# Patient Record
Sex: Male | Born: 1935 | Race: White | Hispanic: No | Smoking: Former smoker
Health system: Southern US, Community
[De-identification: ages and names within clinical notes are randomized; demographics above are authoritative.]

## PROBLEM LIST (undated history)

## (undated) DIAGNOSIS — E785 Hyperlipidemia, unspecified: Secondary | ICD-10-CM

## (undated) DIAGNOSIS — J42 Unspecified chronic bronchitis: Secondary | ICD-10-CM

## (undated) DIAGNOSIS — R0600 Dyspnea, unspecified: Secondary | ICD-10-CM

## (undated) DIAGNOSIS — T783XXA Angioneurotic edema, initial encounter: Secondary | ICD-10-CM

## (undated) DIAGNOSIS — T7840XA Allergy, unspecified, initial encounter: Secondary | ICD-10-CM

## (undated) DIAGNOSIS — R05 Cough: Secondary | ICD-10-CM

## (undated) DIAGNOSIS — R053 Chronic cough: Secondary | ICD-10-CM

## (undated) DIAGNOSIS — Z87442 Personal history of urinary calculi: Secondary | ICD-10-CM

## (undated) DIAGNOSIS — R2241 Localized swelling, mass and lump, right lower limb: Secondary | ICD-10-CM

## (undated) DIAGNOSIS — H269 Unspecified cataract: Secondary | ICD-10-CM

## (undated) DIAGNOSIS — R011 Cardiac murmur, unspecified: Secondary | ICD-10-CM

## (undated) DIAGNOSIS — J069 Acute upper respiratory infection, unspecified: Secondary | ICD-10-CM

## (undated) DIAGNOSIS — Z77090 Contact with and (suspected) exposure to asbestos: Secondary | ICD-10-CM

## (undated) DIAGNOSIS — W64XXXA Exposure to other animate mechanical forces, initial encounter: Secondary | ICD-10-CM

## (undated) HISTORY — DX: Localized swelling, mass and lump, right lower limb: R22.41

## (undated) HISTORY — DX: Unspecified cataract: H26.9

## (undated) HISTORY — DX: Hyperlipidemia, unspecified: E78.5

## (undated) HISTORY — DX: Acute upper respiratory infection, unspecified: J06.9

## (undated) HISTORY — DX: Cardiac murmur, unspecified: R01.1

## (undated) HISTORY — DX: Exposure to other animate mechanical forces, initial encounter: W64.XXXA

## (undated) HISTORY — DX: Unspecified chronic bronchitis: J42

## (undated) HISTORY — PX: EYE SURGERY: SHX253

## (undated) HISTORY — DX: Angioneurotic edema, initial encounter: T78.3XXA

## (undated) HISTORY — DX: Allergy, unspecified, initial encounter: T78.40XA

---

## 2001-09-08 ENCOUNTER — Ambulatory Visit (HOSPITAL_COMMUNITY): Admission: RE | Admit: 2001-09-08 | Discharge: 2001-09-08 | Payer: Self-pay | Admitting: Gastroenterology

## 2002-07-11 ENCOUNTER — Ambulatory Visit (HOSPITAL_COMMUNITY): Admission: RE | Admit: 2002-07-11 | Discharge: 2002-07-11 | Payer: Self-pay | Admitting: Pulmonary Disease

## 2002-07-20 ENCOUNTER — Ambulatory Visit (HOSPITAL_COMMUNITY): Admission: RE | Admit: 2002-07-20 | Discharge: 2002-07-20 | Payer: Self-pay | Admitting: Pulmonary Disease

## 2003-08-17 ENCOUNTER — Ambulatory Visit (HOSPITAL_COMMUNITY): Admission: RE | Admit: 2003-08-17 | Discharge: 2003-08-17 | Payer: Self-pay | Admitting: Pulmonary Disease

## 2009-01-29 ENCOUNTER — Ambulatory Visit (HOSPITAL_COMMUNITY): Admission: RE | Admit: 2009-01-29 | Discharge: 2009-01-29 | Payer: Self-pay | Admitting: Pulmonary Disease

## 2011-11-27 ENCOUNTER — Emergency Department (HOSPITAL_COMMUNITY): Payer: Medicare Other

## 2011-11-27 ENCOUNTER — Encounter (HOSPITAL_COMMUNITY): Payer: Self-pay | Admitting: Emergency Medicine

## 2011-11-27 ENCOUNTER — Emergency Department (HOSPITAL_COMMUNITY)
Admission: EM | Admit: 2011-11-27 | Discharge: 2011-11-27 | Disposition: A | Discharge status: Home / Self Care | Payer: Medicare Other | Attending: Emergency Medicine | Admitting: Emergency Medicine

## 2011-11-27 DIAGNOSIS — R05 Cough: Secondary | ICD-10-CM

## 2011-11-27 DIAGNOSIS — J9801 Acute bronchospasm: Secondary | ICD-10-CM

## 2011-11-27 HISTORY — DX: Contact with and (suspected) exposure to asbestos: Z77.090

## 2011-11-27 HISTORY — DX: Cough: R05

## 2011-11-27 HISTORY — DX: Chronic cough: R05.3

## 2011-11-27 HISTORY — DX: Hyperlipidemia, unspecified: E78.5

## 2011-11-27 HISTORY — DX: Dyspnea, unspecified: R06.00

## 2011-11-27 LAB — BASIC METABOLIC PANEL
BUN: 10 mg/dL (ref 6–23)
CO2: 29 mEq/L (ref 19–32)
Calcium: 9.7 mg/dL (ref 8.4–10.5)
Chloride: 98 mEq/L (ref 96–112)
Creatinine, Ser: 1.12 mg/dL (ref 0.50–1.35)
GFR calc Af Amer: 73 mL/min — ABNORMAL LOW (ref >90)
GFR calc non Af Amer: 63 mL/min — ABNORMAL LOW (ref >90)
Glucose, Bld: 104 mg/dL — ABNORMAL HIGH (ref 70–99)
Potassium: 4.7 mEq/L (ref 3.5–5.1)
Sodium: 134 mEq/L — ABNORMAL LOW (ref 135–145)

## 2011-11-27 LAB — TROPONIN I: Troponin I: <0.3 ng/mL (ref <0.30)

## 2011-11-27 LAB — CBC WITH DIFFERENTIAL/PLATELET
Basophils Absolute: 0.1 10*3/uL (ref 0.0–0.1)
Basophils Relative: 1 % (ref 0–1)
Eosinophils Absolute: 0.8 10*3/uL — ABNORMAL HIGH (ref 0.0–0.7)
Eosinophils Relative: 12 % — ABNORMAL HIGH (ref 0–5)
HCT: 44.8 % (ref 39.0–52.0)
Hemoglobin: 14.6 g/dL (ref 13.0–17.0)
Lymphocytes Relative: 28 % (ref 12–46)
Lymphs Abs: 2 10*3/uL (ref 0.7–4.0)
MCH: 29.9 pg (ref 26.0–34.0)
MCHC: 32.6 g/dL (ref 30.0–36.0)
MCV: 91.8 fL (ref 78.0–100.0)
Monocytes Absolute: 0.7 10*3/uL (ref 0.1–1.0)
Monocytes Relative: 10 % (ref 3–12)
Neutro Abs: 3.4 10*3/uL (ref 1.7–7.7)
Neutrophils Relative %: 49 % (ref 43–77)
Platelets: 248 10*3/uL (ref 150–400)
RBC: 4.88 MIL/uL (ref 4.22–5.81)
RDW: 13.7 % (ref 11.5–15.5)
WBC: 7 10*3/uL (ref 4.0–10.5)

## 2011-11-27 LAB — PRO B NATRIURETIC PEPTIDE: Pro B Natriuretic peptide (BNP): 74.9 pg/mL (ref 0–125)

## 2011-11-27 MED ORDER — IPRATROPIUM BROMIDE 0.02 % IN SOLN
1.0000 mg | Freq: Once | RESPIRATORY_TRACT | Status: AC
  Administered 2011-11-27: 0.5 mg via RESPIRATORY_TRACT
  Filled 2011-11-27: qty 2.5

## 2011-11-27 MED ORDER — ALBUTEROL SULFATE HFA 108 (90 BASE) MCG/ACT IN AERS
2.0000 | INHALATION_SPRAY | RESPIRATORY_TRACT | Status: AC
  Administered 2011-11-27: 2 via RESPIRATORY_TRACT
  Filled 2011-11-27: qty 6.7

## 2011-11-27 MED ORDER — PREDNISONE 20 MG PO TABS: 40.0000 mg | ORAL_TABLET | Freq: Every day | ORAL | Status: DC

## 2011-11-27 MED ORDER — PREDNISONE 20 MG PO TABS
60.0000 mg | ORAL_TABLET | Freq: Once | ORAL | Status: AC
  Administered 2011-11-27: 60 mg via ORAL
  Filled 2011-11-27: qty 3

## 2011-11-27 MED ORDER — BENZONATATE 100 MG PO CAPS: 100.0000 mg | ORAL_CAPSULE | Freq: Three times a day (TID) | ORAL | Status: DC | PRN

## 2011-11-27 MED ORDER — ALBUTEROL SULFATE (5 MG/ML) 0.5% IN NEBU
10.0000 mg | INHALATION_SOLUTION | Freq: Once | RESPIRATORY_TRACT | Status: AC
  Administered 2011-11-27: 10 mg via RESPIRATORY_TRACT

## 2011-11-27 MED ORDER — ALBUTEROL (5 MG/ML) CONTINUOUS INHALATION SOLN
INHALATION_SOLUTION | RESPIRATORY_TRACT | Status: AC
  Filled 2011-11-27: qty 20

## 2011-11-27 NOTE — ED Provider Notes (Addendum)
History     CSN: [id number]  Arrival date & time 11/27/11  1141   First MD Initiated Contact with Patient 11/27/11 1312      Chief Complaint  Patient presents with  . Shortness of Breath     HPI Pt was seen at 1330.  Per pt, c/o gradual onset and persistence of constant cough, "wheezing," SOB,  sinus congestion, and runny/stuffy nose for the past 6 months, worse over the past 3 days. Pt was eval by his PMD, ENT and Allergy MD for same symptoms without a definitive dx.  Pt states he was recently on a course of prednisone, which made his symptoms better.  States his symptoms "come back" after he stops the prednisone.  Denies having MDI or neb at home.  Denies CP/palpitations, no abd pain, no N/V/D, no fevers, no rash.    Past Medical History  Diagnosis Date  . Hyperlipidemia   . Chronic cough   . Dyspnea     chronic  . Asbestos exposure     History reviewed. No pertinent past surgical history.   History  Substance Use Topics  . Smoking status: Former Games developer  . Smokeless tobacco: Not on file  . Alcohol Use: No     Review of Systems ROS: Statement: All systems negative except as marked or noted in the HPI; Constitutional: Negative for fever and chills. ; ; Eyes: Negative for eye pain, redness and discharge. ; ; ENMT: Negative for ear pain, hoarseness, sore throat. +nasal congestion, sinus pressure. ; ; Cardiovascular: Negative for chest pain, palpitations, diaphoresis, and peripheral edema. ; ; Respiratory: +cough, wheezing, SOB. Negative for stridor. ; ; Gastrointestinal: Negative for nausea, vomiting, diarrhea, abdominal pain, blood in stool, hematemesis, jaundice and rectal bleeding. . ; ; Genitourinary: Negative for dysuria, flank pain and hematuria. ; ; Musculoskeletal: Negative for back pain and neck pain. Negative for swelling and trauma.; ; Skin: Negative for pruritus, rash, abrasions, blisters, bruising and skin lesion.; ; Neuro: Negative for headache, lightheadedness and  neck stiffness. Negative for weakness, altered level of consciousness , altered mental status, extremity weakness, paresthesias, involuntary movement, seizure and syncope.      Allergies  Review of patient's allergies indicates no known allergies.  Home Medications   Current Outpatient Rx  Name Route Sig Dispense Refill  . ATORVASTATIN CALCIUM 20 MG PO TABS Oral Take 20 mg by mouth daily.    Marland Kitchen FLUTICASONE PROPIONATE 50 MCG/ACT NA SUSP Nasal Place 2 sprays into the nose daily.    . SULFAMETHOXAZOLE-TMP DS 800-160 MG PO TABS Oral Take 1 tablet by mouth 2 (two) times daily.    Marland Kitchen BENZONATATE 100 MG PO CAPS Oral Take 1 capsule (100 mg total) by mouth 3 (three) times daily as needed for cough. 20 capsule 0  . PREDNISONE 20 MG PO TABS Oral Take 2 tablets (40 mg total) by mouth daily. 10 tablet 0    BP 146/65  Pulse 92  Temp 97.6 F (36.4 C) (Oral)  Resp 20  Ht 5\' 8"  (1.727 m)  Wt 182 lb (82.555 kg)  BMI 27.67 kg/m2  SpO2 96%  Physical Exam 1335: Physical examination:  Nursing notes reviewed; Vital signs and O2 SAT reviewed;  Constitutional: Well developed, Well nourished, Well hydrated, In no acute distress; Head:  Normocephalic, atraumatic; Eyes: EOMI, PERRL, No scleral icterus; ENMT: Mouth and pharynx normal, Mucous membranes moist; Neck: Supple, Full range of motion, No lymphadenopathy; Cardiovascular: Regular rate and rhythm, No gallop; Respiratory: Breath sounds coarse &  equal bilaterally, with wheezes bilat.  Speaking full sentences. Normal respiratory effort/excursion; Chest: Nontender, Movement normal; Abdomen: Soft, Nontender, Nondistended, Normal bowel sounds; Extremities: Pulses normal, No tenderness, No edema, No calf edema or asymmetry.; Neuro: AA&Ox3, Major CN grossly intact.  Speech clear. No gross focal motor or sensory deficits in extremities.; Skin: Color normal, Warm, Dry.   ED Course  Procedures    MDM  MDM Reviewed: nursing note, vitals and previous  chart Interpretation: ECG, labs and x-ray    Date: 11/27/2011  Rate: 76  Rhythm: normal sinus rhythm  QRS Axis: normal  Intervals: normal  ST/T Wave abnormalities: normal  Conduction Disutrbances:none  Narrative Interpretation:   Old EKG Reviewed: none available.  Results for orders placed during the hospital encounter of 11/27/11  BASIC METABOLIC PANEL      Component Value Range   Sodium 134 (*) 135 - 145 mEq/L   Potassium 4.7  3.5 - 5.1 mEq/L   Chloride 98  96 - 112 mEq/L   CO2 29  19 - 32 mEq/L   Glucose, Bld 104 (*) 70 - 99 mg/dL   BUN 10  6 - 23 mg/dL   Creatinine, Ser 4.54  0.50 - 1.35 mg/dL   Calcium 9.7  8.4 - 09.8 mg/dL   GFR calc non Af Amer 63 (*) >90 mL/min   GFR calc Af Amer 73 (*) >90 mL/min  CBC WITH DIFFERENTIAL      Component Value Range   WBC 7.0  4.0 - 10.5 K/uL   RBC 4.88  4.22 - 5.81 MIL/uL   Hemoglobin 14.6  13.0 - 17.0 g/dL   HCT 11.9  14.7 - 82.9 %   MCV 91.8  78.0 - 100.0 fL   MCH 29.9  26.0 - 34.0 pg   MCHC 32.6  30.0 - 36.0 g/dL   RDW 56.2  13.0 - 86.5 %   Platelets 248  150 - 400 K/uL   Neutrophils Relative 49  43 - 77 %   Neutro Abs 3.4  1.7 - 7.7 K/uL   Lymphocytes Relative 28  12 - 46 %   Lymphs Abs 2.0  0.7 - 4.0 K/uL   Monocytes Relative 10  3 - 12 %   Monocytes Absolute 0.7  0.1 - 1.0 K/uL   Eosinophils Relative 12 (*) 0 - 5 %   Eosinophils Absolute 0.8 (*) 0.0 - 0.7 K/uL   Basophils Relative 1  0 - 1 %   Basophils Absolute 0.1  0.0 - 0.1 K/uL  PRO B NATRIURETIC PEPTIDE      Component Value Range   Pro B Natriuretic peptide (BNP) 74.9  0 - 125 pg/mL  TROPONIN I      Component Value Range   Troponin I <0.30  <0.30 ng/mL   Dg Chest Port 1 View 11/27/2011  *RADIOLOGY REPORT*  Clinical Data:  Cough, shortness of breath  PORTABLE CHEST - 1 VIEW  Comparison: Portable exam 1524 hours compared to 01/29/2009  Findings: Normal heart size, mediastinal contours, and pulmonary vascularity. Atherosclerotic calcification aorta. Pleural  calcifications bilaterally question prior asbestos exposure. Emphysematous and minimal bronchitic changes. Minimal atelectasis left base. No infiltrate, pleural effusion or pneumothorax. No acute osseous findings.  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 Emphysematous and bronchitic changes with minimal atelectasis at left base.   Original Report Authenticated By: Lollie Marrow, M.D.      1700:   Pt feels "much better now" after neb and steroid and wants to  go home now.  Ambulating around ED exam room with steady gait, easy resps, lungs CTA bilat, Sats 96% R/A.   Dx testing d/w pt and family.  Questions answered.  Verb understanding, agreeable to d/c home with outpt f/u.             Laray Anger, DO 11/29/11 1345  Laray Anger, DO 12/10/11 1701

## 2011-11-27 NOTE — ED Notes (Signed)
Pt c/o sob and coughing x 3 days.

## 2011-11-27 NOTE — ED Notes (Signed)
Ambulated pt around nurses station; o2 level stayed at 95%. FG

## 2011-11-27 NOTE — ED Notes (Signed)
Pt presents with c/o SOB with exertion, post nasal congestion, unable to lye flat, BBS with noted expiratory and inspitory wheezing. Pt states has been treated by Dr Juanetta Gosling x 6 months for same symptoms. Pt states has seen Dr Suszanne Conners, and an allergist in regards to his breathing difficulty. Pt reports has been on prednisone taper x 5 and gets a steroid shot, and that is what seems to help. Pt can tell when he is almost finished with the steroids as the symptoms come back.  Pt states has a productive cough secondary to post nasal drip. NAD noted at this time.

## 2012-05-17 ENCOUNTER — Other Ambulatory Visit (HOSPITAL_COMMUNITY): Payer: Self-pay

## 2012-05-17 DIAGNOSIS — J441 Chronic obstructive pulmonary disease with (acute) exacerbation: Secondary | ICD-10-CM

## 2012-05-27 ENCOUNTER — Ambulatory Visit (HOSPITAL_COMMUNITY)
Admission: RE | Admit: 2012-05-27 | Discharge: 2012-05-27 | Disposition: A | Discharge status: Home / Self Care | Payer: Medicare Other | Source: Ambulatory Visit | Attending: Pulmonary Disease | Admitting: Pulmonary Disease

## 2012-05-27 DIAGNOSIS — R0989 Other specified symptoms and signs involving the circulatory and respiratory systems: Secondary | ICD-10-CM | POA: Insufficient documentation

## 2012-05-27 DIAGNOSIS — R0609 Other forms of dyspnea: Secondary | ICD-10-CM | POA: Insufficient documentation

## 2012-05-27 MED ORDER — ALBUTEROL SULFATE (5 MG/ML) 0.5% IN NEBU
2.5000 mg | INHALATION_SOLUTION | Freq: Once | RESPIRATORY_TRACT | Status: AC
  Administered 2012-05-27: 2.5 mg via RESPIRATORY_TRACT

## 2012-06-01 NOTE — Procedures (Signed)
NAMEAMONTAE, NG               ACCOUNT NO.:  [id number]  MEDICAL RECORD NO.:  [id number]  LOCATION:  RESP                          FACILITY:  [facility]  PHYSICIAN:  Gwen Sarvis L. Juanetta Gosling, M.D.DATE OF BIRTH:  [birth year]  DATE OF PROCEDURE: DATE OF DISCHARGE:  05/27/2012                           PULMONARY FUNCTION TEST   Reason for pulmonary function testing is chronic obstructive pulmonary disease. 1. Spirometry shows a mild ventilatory defect with evidence of airflow     obstruction. 2. Lung volumes show air trapping. 3. DLCO is mildly reduced. 4. Airway resistance is elevated confirming the presence of airflow     obstruction. 5. There is significant bronchodilator improvement. 6. This is consistent with the clinical diagnosis of COPD.     Jerian Morais L. Juanetta Gosling, M.D.     ELH/MEDQ  D:  05/31/2012  T:  06/01/2012  Job:  [id number]

## 2012-06-03 LAB — PULMONARY FUNCTION TEST

## 2014-06-28 DIAGNOSIS — H2513 Age-related nuclear cataract, bilateral: Secondary | ICD-10-CM | POA: Diagnosis not present

## 2014-06-28 DIAGNOSIS — H538 Other visual disturbances: Secondary | ICD-10-CM | POA: Diagnosis not present

## 2014-07-10 ENCOUNTER — Ambulatory Visit (HOSPITAL_COMMUNITY)
Admission: RE | Admit: 2014-07-10 | Discharge: 2014-07-10 | Disposition: A | Discharge status: Home / Self Care | Payer: Medicare Other | Source: Ambulatory Visit | Attending: Pulmonary Disease | Admitting: Pulmonary Disease

## 2014-07-10 ENCOUNTER — Other Ambulatory Visit (HOSPITAL_COMMUNITY): Payer: Self-pay | Admitting: Pulmonary Disease

## 2014-07-10 DIAGNOSIS — R52 Pain, unspecified: Secondary | ICD-10-CM

## 2014-07-10 DIAGNOSIS — M19011 Primary osteoarthritis, right shoulder: Secondary | ICD-10-CM | POA: Diagnosis not present

## 2014-07-10 DIAGNOSIS — M25511 Pain in right shoulder: Secondary | ICD-10-CM | POA: Insufficient documentation

## 2014-07-10 DIAGNOSIS — M79601 Pain in right arm: Secondary | ICD-10-CM | POA: Diagnosis not present

## 2014-07-10 DIAGNOSIS — M4802 Spinal stenosis, cervical region: Secondary | ICD-10-CM | POA: Diagnosis not present

## 2014-10-30 DIAGNOSIS — Z1211 Encounter for screening for malignant neoplasm of colon: Secondary | ICD-10-CM | POA: Diagnosis not present

## 2014-10-30 DIAGNOSIS — E785 Hyperlipidemia, unspecified: Secondary | ICD-10-CM | POA: Diagnosis not present

## 2014-10-30 DIAGNOSIS — Z0000 Encounter for general adult medical examination without abnormal findings: Secondary | ICD-10-CM | POA: Diagnosis not present

## 2014-11-28 DIAGNOSIS — M79672 Pain in left foot: Secondary | ICD-10-CM | POA: Diagnosis not present

## 2014-11-28 DIAGNOSIS — M25579 Pain in unspecified ankle and joints of unspecified foot: Secondary | ICD-10-CM | POA: Diagnosis not present

## 2015-01-24 DIAGNOSIS — H538 Other visual disturbances: Secondary | ICD-10-CM | POA: Diagnosis not present

## 2015-01-24 DIAGNOSIS — H2511 Age-related nuclear cataract, right eye: Secondary | ICD-10-CM | POA: Diagnosis not present

## 2015-03-12 NOTE — Patient Instructions (Signed)
Your procedure is scheduled on: 03/19/2015  Report to [facility] at  845   AM.  Call this number if you have problems the morning of surgery: [phone]   Do not eat food or drink liquids :After Midnight.      Take these medicines the morning of surgery with A SIP OF WATER: tessalon   Do not wear jewelry, make-up or nail polish.  Do not wear lotions, powders, or perfumes. You may wear deodorant.  Do not shave 48 hours prior to surgery.  Do not bring valuables to the hospital.  Contacts, dentures or bridgework may not be worn into surgery.  Leave suitcase in the car. After surgery it may be brought to your room.  For patients admitted to the hospital, checkout time is 11:00 AM the day of discharge.   Patients discharged the day of surgery will not be allowed to drive home.  :     Please read over the following fact sheets that you were given: Coughing and Deep Breathing, Surgical Site Infection Prevention, Anesthesia Post-op Instructions and Care and Recovery After Surgery    Cataract A cataract is a clouding of the lens of the eye. When a lens becomes cloudy, vision is reduced based on the degree and nature of the clouding. Many cataracts reduce vision to some degree. Some cataracts make people more near-sighted as they develop. Other cataracts increase glare. Cataracts that are ignored and become worse can sometimes look white. The white color can be seen through the pupil. CAUSES   Aging. However, cataracts may occur at any age, even in newborns.   Certain drugs.   Trauma to the eye.   Certain diseases such as diabetes.   Specific eye diseases such as chronic inflammation inside the eye or a sudden attack of a rare form of glaucoma.   Inherited or acquired medical problems.  SYMPTOMS   Gradual, progressive drop in vision in the affected eye.   Severe, rapid visual loss. This most often happens when trauma is the cause.  DIAGNOSIS  To detect a cataract, an eye doctor  examines the lens. Cataracts are best diagnosed with an exam of the eyes with the pupils enlarged (dilated) by drops.  TREATMENT  For an early cataract, vision may improve by using different eyeglasses or stronger lighting. If that does not help your vision, surgery is the only effective treatment. A cataract needs to be surgically removed when vision loss interferes with your everyday activities, such as driving, reading, or watching TV. A cataract may also have to be removed if it prevents examination or treatment of another eye problem. Surgery removes the cloudy lens and usually replaces it with a substitute lens (intraocular lens, IOL).  At a time when both you and your doctor agree, the cataract will be surgically removed. If you have cataracts in both eyes, only one is usually removed at a time. This allows the operated eye to heal and be out of danger from any possible problems after surgery (such as infection or poor wound healing). In rare cases, a cataract may be doing damage to your eye. In these cases, your caregiver may advise surgical removal right away. The vast majority of people who have cataract surgery have better vision afterward. HOME CARE INSTRUCTIONS  If you are not planning surgery, you may be asked to do the following:  Use different eyeglasses.   Use stronger or brighter lighting.   Ask your eye doctor about reducing your medicine dose  or changing medicines if it is thought that a medicine caused your cataract. Changing medicines does not make the cataract go away on its own.   Become familiar with your surroundings. Poor vision can lead to injury. Avoid bumping into things on the affected side. You are at a higher risk for tripping or falling.   Exercise extreme care when driving or operating machinery.   Wear sunglasses if you are sensitive to bright light or experiencing problems with glare.  SEEK IMMEDIATE MEDICAL CARE IF:   You have a worsening or sudden vision  loss.   You notice redness, swelling, or increasing pain in the eye.   You have a fever.  Document Released: 02/17/2005 Document Revised: 02/06/2011 Document Reviewed: 10/11/2010 [facility] Patient Information 2012 [place].PATIENT INSTRUCTIONS POST-ANESTHESIA  IMMEDIATELY FOLLOWING SURGERY:  Do not drive or operate machinery for the first twenty four hours after surgery.  Do not make any important decisions for twenty four hours after surgery or while taking narcotic pain medications or sedatives.  If you develop intractable nausea and vomiting or a severe headache please notify your doctor immediately.  FOLLOW-UP:  Please make an appointment with your surgeon as instructed. You do not need to follow up with anesthesia unless specifically instructed to do so.  WOUND CARE INSTRUCTIONS (if applicable):  Keep a dry clean dressing on the anesthesia/puncture wound site if there is drainage.  Once the wound has quit draining you may leave it open to air.  Generally you should leave the bandage intact for twenty four hours unless there is drainage.  If the epidural site drains for more than 36-48 hours please call the anesthesia department.  QUESTIONS?:  Please feel free to call your physician or the hospital operator if you have any questions, and they will be happy to assist you.

## 2015-03-13 ENCOUNTER — Other Ambulatory Visit: Payer: Self-pay

## 2015-03-13 ENCOUNTER — Encounter (HOSPITAL_COMMUNITY)
Admission: RE | Admit: 2015-03-13 | Discharge: 2015-03-13 | Disposition: A | Discharge status: Home / Self Care | Payer: Medicare Other | Source: Ambulatory Visit | Attending: Ophthalmology | Admitting: Ophthalmology

## 2015-03-13 ENCOUNTER — Encounter (HOSPITAL_COMMUNITY): Payer: Self-pay

## 2015-03-13 DIAGNOSIS — I498 Other specified cardiac arrhythmias: Secondary | ICD-10-CM | POA: Insufficient documentation

## 2015-03-13 DIAGNOSIS — Z01818 Encounter for other preprocedural examination: Secondary | ICD-10-CM | POA: Diagnosis not present

## 2015-03-13 DIAGNOSIS — H2511 Age-related nuclear cataract, right eye: Secondary | ICD-10-CM | POA: Insufficient documentation

## 2015-03-13 DIAGNOSIS — H538 Other visual disturbances: Secondary | ICD-10-CM | POA: Diagnosis not present

## 2015-03-13 DIAGNOSIS — Z01812 Encounter for preprocedural laboratory examination: Secondary | ICD-10-CM | POA: Insufficient documentation

## 2015-03-13 LAB — CBC WITH DIFFERENTIAL/PLATELET
Basophils Absolute: 0 10*3/uL (ref 0.0–0.1)
Basophils Relative: 0 %
Eosinophils Absolute: 0 10*3/uL (ref 0.0–0.7)
Eosinophils Relative: 0 %
HCT: 45.5 % (ref 39.0–52.0)
Hemoglobin: 15.1 g/dL (ref 13.0–17.0)
Lymphocytes Relative: 11 %
Lymphs Abs: 1.2 10*3/uL (ref 0.7–4.0)
MCH: 31.1 pg (ref 26.0–34.0)
MCHC: 33.2 g/dL (ref 30.0–36.0)
MCV: 93.8 fL (ref 78.0–100.0)
Monocytes Absolute: 1 10*3/uL (ref 0.1–1.0)
Monocytes Relative: 9 %
Neutro Abs: 8.6 10*3/uL — ABNORMAL HIGH (ref 1.7–7.7)
Neutrophils Relative %: 80 %
Platelets: 257 10*3/uL (ref 150–400)
RBC: 4.85 MIL/uL (ref 4.22–5.81)
RDW: 13.7 % (ref 11.5–15.5)
WBC: 10.8 10*3/uL — ABNORMAL HIGH (ref 4.0–10.5)

## 2015-03-13 LAB — BASIC METABOLIC PANEL
Anion gap: 8 (ref 5–15)
BUN: 18 mg/dL (ref 6–20)
CO2: 26 mmol/L (ref 22–32)
Calcium: 9.2 mg/dL (ref 8.9–10.3)
Chloride: 103 mmol/L (ref 101–111)
Creatinine, Ser: 0.93 mg/dL (ref 0.61–1.24)
GFR calc Af Amer: >60 mL/min (ref >60)
GFR calc non Af Amer: >60 mL/min (ref >60)
Glucose, Bld: 113 mg/dL — ABNORMAL HIGH (ref 65–99)
Potassium: 4.3 mmol/L (ref 3.5–5.1)
Sodium: 137 mmol/L (ref 135–145)

## 2015-03-13 NOTE — Pre-Procedure Instructions (Signed)
Patient given information to sign up for my chart at home. 

## 2015-03-15 DIAGNOSIS — Z23 Encounter for immunization: Secondary | ICD-10-CM | POA: Diagnosis not present

## 2015-03-19 ENCOUNTER — Ambulatory Visit (HOSPITAL_COMMUNITY): Payer: Medicare Other | Admitting: Anesthesiology

## 2015-03-19 ENCOUNTER — Ambulatory Visit (HOSPITAL_COMMUNITY)
Admission: RE | Admit: 2015-03-19 | Discharge: 2015-03-19 | Disposition: A | Discharge status: Home / Self Care | Payer: Medicare Other | Source: Ambulatory Visit | Attending: Ophthalmology | Admitting: Ophthalmology

## 2015-03-19 ENCOUNTER — Encounter (HOSPITAL_COMMUNITY): Payer: Self-pay | Admitting: *Deleted

## 2015-03-19 ENCOUNTER — Encounter (HOSPITAL_COMMUNITY)
Admission: RE | Disposition: A | Discharge status: Home / Self Care | Payer: Self-pay | Source: Ambulatory Visit | Attending: Ophthalmology

## 2015-03-19 DIAGNOSIS — Z87891 Personal history of nicotine dependence: Secondary | ICD-10-CM | POA: Insufficient documentation

## 2015-03-19 DIAGNOSIS — H268 Other specified cataract: Secondary | ICD-10-CM | POA: Insufficient documentation

## 2015-03-19 DIAGNOSIS — J449 Chronic obstructive pulmonary disease, unspecified: Secondary | ICD-10-CM | POA: Diagnosis not present

## 2015-03-19 DIAGNOSIS — H2511 Age-related nuclear cataract, right eye: Secondary | ICD-10-CM | POA: Diagnosis not present

## 2015-03-19 DIAGNOSIS — H538 Other visual disturbances: Secondary | ICD-10-CM | POA: Diagnosis not present

## 2015-03-19 DIAGNOSIS — H269 Unspecified cataract: Secondary | ICD-10-CM | POA: Diagnosis not present

## 2015-03-19 HISTORY — PX: CATARACT EXTRACTION W/PHACO: SHX586

## 2015-03-19 SURGERY — PHACOEMULSIFICATION, CATARACT, WITH IOL INSERTION
Anesthesia: Monitor Anesthesia Care | Site: Eye | Laterality: Right

## 2015-03-19 MED ORDER — PHENYLEPHRINE-KETOROLAC 1-0.3 % IO SOLN
INTRAOCULAR | Status: AC
  Filled 2015-03-19: qty 4

## 2015-03-19 MED ORDER — FENTANYL CITRATE (PF) 100 MCG/2ML IJ SOLN
INTRAMUSCULAR | Status: AC
  Filled 2015-03-19: qty 2

## 2015-03-19 MED ORDER — LACTATED RINGERS IV SOLN
INTRAVENOUS | Status: DC
  Administered 2015-03-19: 10:00:00 via INTRAVENOUS

## 2015-03-19 MED ORDER — CYCLOPENTOLATE-PHENYLEPHRINE 0.2-1 % OP SOLN
1.0000 [drp] | OPHTHALMIC | Status: AC
  Administered 2015-03-19 (×3): 1 [drp] via OPHTHALMIC

## 2015-03-19 MED ORDER — LIDOCAINE HCL 3.5 % OP GEL
OPHTHALMIC | Status: DC | PRN
  Administered 2015-03-19: 1 via OPHTHALMIC

## 2015-03-19 MED ORDER — TETRACAINE HCL 0.5 % OP SOLN
1.0000 [drp] | OPHTHALMIC | Status: AC
  Administered 2015-03-19 (×3): 1 [drp] via OPHTHALMIC

## 2015-03-19 MED ORDER — BSS IO SOLN
INTRAOCULAR | Status: DC | PRN
  Administered 2015-03-19: 15 mL via INTRAOCULAR

## 2015-03-19 MED ORDER — TETRACAINE 0.5 % OP SOLN OPTIME - NO CHARGE
OPHTHALMIC | Status: DC | PRN
  Administered 2015-03-19: 1 [drp] via OPHTHALMIC

## 2015-03-19 MED ORDER — MIDAZOLAM HCL 2 MG/2ML IJ SOLN
INTRAMUSCULAR | Status: AC
  Filled 2015-03-19: qty 2

## 2015-03-19 MED ORDER — MIDAZOLAM HCL 2 MG/2ML IJ SOLN
1.0000 mg | INTRAMUSCULAR | Status: DC | PRN
  Administered 2015-03-19: 2 mg via INTRAVENOUS

## 2015-03-19 MED ORDER — LIDOCAINE HCL 3.5 % OP GEL: 1.0000 "application " | Freq: Once | OPHTHALMIC | Status: DC

## 2015-03-19 MED ORDER — FENTANYL CITRATE (PF) 100 MCG/2ML IJ SOLN
25.0000 ug | INTRAMUSCULAR | Status: AC
  Administered 2015-03-19 (×2): 25 ug via INTRAVENOUS

## 2015-03-19 MED ORDER — POVIDONE-IODINE 5 % OP SOLN
OPHTHALMIC | Status: DC | PRN
  Administered 2015-03-19: 1 via OPHTHALMIC

## 2015-03-19 MED ORDER — PHENYLEPHRINE-KETOROLAC 1-0.3 % IO SOLN
INTRAOCULAR | Status: DC | PRN
  Administered 2015-03-19: 500 mL via OPHTHALMIC

## 2015-03-19 MED ORDER — NA HYALUR & NA CHOND-NA HYALUR 0.55-0.5 ML IO KIT
PACK | INTRAOCULAR | Status: DC | PRN
  Administered 2015-03-19: 1 via OPHTHALMIC

## 2015-03-19 SURGICAL SUPPLY — 28 items
CAPSULAR TENSION RING-AMO (OPHTHALMIC RELATED) IMPLANT
CLOTH BEACON ORANGE TIMEOUT ST (SAFETY) ×2 IMPLANT
GLOVE BIO SURGEON STRL SZ7.5 (GLOVE) IMPLANT
GLOVE BIOGEL M 6.5 STRL (GLOVE) IMPLANT
GLOVE BIOGEL PI IND STRL 6.5 (GLOVE) IMPLANT
GLOVE BIOGEL PI IND STRL 7.0 (GLOVE) IMPLANT
GLOVE BIOGEL PI INDICATOR 6.5 (GLOVE) ×2
GLOVE BIOGEL PI INDICATOR 7.0 (GLOVE)
GLOVE ECLIPSE 6.5 STRL STRAW (GLOVE) IMPLANT
GLOVE ECLIPSE 7.5 STRL STRAW (GLOVE) IMPLANT
GLOVE EXAM NITRILE LRG STRL (GLOVE) IMPLANT
GLOVE EXAM NITRILE MD LF STRL (GLOVE) ×2 IMPLANT
GLOVE SKINSENSE NS SZ6.5 (GLOVE)
GLOVE SKINSENSE NS SZ7.0 (GLOVE)
GLOVE SKINSENSE STRL SZ6.5 (GLOVE) IMPLANT
GLOVE SKINSENSE STRL SZ7.0 (GLOVE) IMPLANT
INST SET CATARACT ~~LOC~~ (KITS) ×3 IMPLANT
KIT VITRECTOMY (OPHTHALMIC RELATED) IMPLANT
LENS ALC ACRYL/TECN (Ophthalmic Related) ×3 IMPLANT
PAD ARMBOARD 7.5X6 YLW CONV (MISCELLANEOUS) ×2 IMPLANT
PROC W NO LENS (INTRAOCULAR LENS)
PROC W SPEC LENS (INTRAOCULAR LENS)
PROCESS W NO LENS (INTRAOCULAR LENS) IMPLANT
PROCESS W SPEC LENS (INTRAOCULAR LENS) IMPLANT
RETRACTOR IRIS SIGHTPATH (OPHTHALMIC RELATED) IMPLANT
RING MALYGIN (MISCELLANEOUS) IMPLANT
VISCOELASTIC ADDITIONAL (OPHTHALMIC RELATED) IMPLANT
WATER STERILE IRR 250ML POUR (IV SOLUTION) ×2 IMPLANT

## 2015-03-19 NOTE — Brief Op Note (Signed)
03/19/2015  11:51 AM  PATIENT:  Ryan Hanson  80 y.o. male  PRE-OPERATIVE DIAGNOSIS:  nuclear cataract right eye  POST-OPERATIVE DIAGNOSIS:  nuclear cataract right eye  PROCEDURE:  Procedure(s): CATARACT EXTRACTION PHACO AND INTRAOCULAR LENS PLACEMENT; CDE:  9.70  SURGEON:  Surgeon(s): Williams Che, MD  ASSISTANTS: Bonney Roussel, CST   ANESTHESIA STAFF: Anesthesiologist: Lerry Liner, MD CRNA: Charmaine Downs, CRNA  ANESTHESIA:   topical and MAC  REQUESTED LENS POWER: 21.5  LENS IMPLANT INFORMATION:   Alcon  SN [id number]   S/n [id number]   Exp  07/2019  CUMULATIVE DISSIPATED ENERGY:9.70  INDICATIONS:see scanned office H&P for particulars  OP FINDINGS:mod. dense NS  COMPLICATIONS:None  DICTATION #: none  PLAN OF CARE: as above  PATIENT DISPOSITION:  Short Stay

## 2015-03-19 NOTE — H&P (Signed)
I have reviewed the pre printed H&P, the patient was re-examined, and I have identified no significant interval changes in the patient's medical condition.  There is no change in the plan of care since the history and physical of record. 

## 2015-03-19 NOTE — Transfer of Care (Signed)
Immediate Anesthesia Transfer of Care Note  Patient: Ryan Hanson  Procedure(s) Performed: Procedure(s): CATARACT EXTRACTION PHACO AND INTRAOCULAR LENS PLACEMENT; CDE:  9.70 (Right)  Patient Location: PACU  Anesthesia Type:MAC  Level of Consciousness: awake, alert , oriented and patient cooperative  Airway & Oxygen Therapy: Patient Spontanous Breathing  Post-op Assessment: Report given to RN, Post -op Vital signs reviewed and stable and Patient moving all extremities  Post vital signs: Reviewed and stable  Last Vitals:  Filed Vitals:   03/19/15 1100 03/19/15 1105  BP: 142/83 153/82  Temp:    Resp: 10 14    Complications: No apparent anesthesia complications

## 2015-03-19 NOTE — Op Note (Signed)
03/19/2015  11:51 AM  PATIENT:  Ryan Hanson  80 y.o. male  PRE-OPERATIVE DIAGNOSIS:  nuclear cataract right eye  POST-OPERATIVE DIAGNOSIS:  nuclear cataract right eye  PROCEDURE:  Procedure(s): CATARACT EXTRACTION PHACO AND INTRAOCULAR LENS PLACEMENT; CDE:  9.70  SURGEON:  Surgeon(s): Williams Che, MD  ASSISTANTS: Bonney Roussel, CST   ANESTHESIA STAFF: Anesthesiologist: Lerry Liner, MD CRNA: Charmaine Downs, CRNA  ANESTHESIA:   topical and MAC  REQUESTED LENS POWER: 21.5  LENS IMPLANT INFORMATION:   Alcon  SN [id number]   S/n [id number]   Exp  07/2019  CUMULATIVE DISSIPATED ENERGY:9.70  INDICATIONS:see scanned office H&P for particulars  OP FINDINGS:mod. dense NS  COMPLICATIONS:None  PROCEDURE:  The patient was brought to the operating room in good condition.  The operative eye was prepped and draped in the usual fashion for intraocular surgery.  Lidocaine gel was dropped onto the eye.  A 2.4 mm 10 O'clock near clear corneal stepped incision and a 12 O'clock stab incision were created.  Viscoat was instilled into the anterior chamber.  The 5 mm anterior capsulorhexis was performed with a bent needle cystotome and Utrata forceps.  The lens was hydrodissected and hydrodelineated with a cannula and balanced salt solution and rotated with a Kuglen hook.  Phacoemulsification was perfomed in the divide and conquer technique.  The remaining cortex was removed with I&A and the capsular surfaces polished as necessary.  Provisc was placed into the capsular bag and the lens inserted with the Alcon inserter.  The viscoelastic was removed with I&A and the lens "rocked" into position.  The wounds were hydrated and te anterior chamber was refilled with balanced salt solution.  The wounds were checked for leakage and rehydrated as necessary.  The lid speculum and drapes were removed and the patient was transported to short stay in good condition.   PATIENT DISPOSITION:  Short Stay

## 2015-03-19 NOTE — Anesthesia Postprocedure Evaluation (Signed)
Anesthesia Post Note  Patient: Ryan Hanson  Procedure(s) Performed: Procedure(s) (LRB): CATARACT EXTRACTION PHACO AND INTRAOCULAR LENS PLACEMENT; CDE:  9.70 (Right)  Patient location during evaluation: Short Stay Anesthesia Type: MAC Level of consciousness: awake and alert and patient cooperative Pain management: pain level controlled Vital Signs Assessment: post-procedure vital signs reviewed and stable Respiratory status: spontaneous breathing Cardiovascular status: blood pressure returned to baseline and stable Postop Assessment: adequate PO intake and no signs of nausea or vomiting Anesthetic complications: no    Last Vitals:  Filed Vitals:   03/19/15 1100 03/19/15 1105  BP: 142/83 153/82  Temp:    Resp: 10 14    Last Pain: There were no vitals filed for this visit.               Yolanda Dockendorf J

## 2015-03-19 NOTE — Anesthesia Preprocedure Evaluation (Signed)
Anesthesia Evaluation  Patient identified by MRN, date of birth, ID band Patient awake    Reviewed: Allergy & Precautions, NPO status , Patient's Chart, lab work & pertinent test results  Airway Mallampati: III  TM Distance: >3 FB     Dental  (+) Teeth Intact   Pulmonary shortness of breath and with exertion, COPD (Asbestosis), former smoker,    breath sounds clear to auscultation       Cardiovascular negative cardio ROS   Rhythm:Regular Rate:Normal     Neuro/Psych    GI/Hepatic negative GI ROS,   Endo/Other    Renal/GU      Musculoskeletal   Abdominal   Peds  Hematology   Anesthesia Other Findings   Reproductive/Obstetrics                             Anesthesia Physical Anesthesia Plan  ASA: III  Anesthesia Plan: MAC   Post-op Pain Management:    Induction: Intravenous  Airway Management Planned: Nasal Cannula  Additional Equipment:   Intra-op Plan:   Post-operative Plan:   Informed Consent: I have reviewed the patients History and Physical, chart, labs and discussed the procedure including the risks, benefits and alternatives for the proposed anesthesia with the patient or authorized representative who has indicated his/her understanding and acceptance.     Plan Discussed with:   Anesthesia Plan Comments:         Anesthesia Quick Evaluation

## 2015-03-19 NOTE — Discharge Instructions (Signed)
Anesthesia, Adult, Care After °Refer to this sheet in the next few weeks. These instructions provide you with information on caring for yourself after your procedure. Your health care provider may also give you more specific instructions. Your treatment has been planned according to current medical practices, but problems sometimes occur. Call your health care provider if you have any problems or questions after your procedure. °WHAT TO EXPECT AFTER THE PROCEDURE °After the procedure, it is typical to experience: °· Sleepiness. °· Nausea and vomiting. °HOME CARE INSTRUCTIONS °· For the first 24 hours after general anesthesia: °¨ Have a responsible person with you. °¨ Do not drive a car. If you are alone, do not take public transportation. °¨ Do not drink alcohol. °¨ Do not take medicine that has not been prescribed by your health care provider. °¨ Do not sign important papers or make important decisions. °¨ You may resume a normal diet and activities as directed by your health care provider. °· Change bandages (dressings) as directed. °· If you have questions or problems that seem related to general anesthesia, call the hospital and ask for the anesthetist or anesthesiologist on call. °SEEK MEDICAL CARE IF: °· You have nausea and vomiting that continue the day after anesthesia. °· You develop a rash. °SEEK IMMEDIATE MEDICAL CARE IF:  °· You have difficulty breathing. °· You have chest pain. °· You have any allergic problems. °  °This information is not intended to replace advice given to you by your health care provider. Make sure you discuss any questions you have with your health care provider. °  °Document Released: 05/26/2000 Document Revised: 03/10/2014 Document Reviewed: 06/18/2011 °Elsevier Interactive Patient Education ©2016 [employer] ° °

## 2015-03-20 ENCOUNTER — Encounter (HOSPITAL_COMMUNITY): Payer: Self-pay | Admitting: Ophthalmology

## 2015-07-04 DIAGNOSIS — R2241 Localized swelling, mass and lump, right lower limb: Secondary | ICD-10-CM | POA: Diagnosis not present

## 2015-07-04 DIAGNOSIS — J42 Unspecified chronic bronchitis: Secondary | ICD-10-CM | POA: Diagnosis not present

## 2015-07-05 ENCOUNTER — Other Ambulatory Visit (HOSPITAL_COMMUNITY): Payer: Self-pay | Admitting: Pulmonary Disease

## 2015-07-05 ENCOUNTER — Ambulatory Visit (HOSPITAL_COMMUNITY)
Admission: RE | Admit: 2015-07-05 | Discharge: 2015-07-05 | Disposition: A | Discharge status: Home / Self Care | Payer: Medicare Other | Source: Ambulatory Visit | Attending: Pulmonary Disease | Admitting: Pulmonary Disease

## 2015-07-05 DIAGNOSIS — M7989 Other specified soft tissue disorders: Secondary | ICD-10-CM

## 2015-10-31 DIAGNOSIS — Z0000 Encounter for general adult medical examination without abnormal findings: Secondary | ICD-10-CM | POA: Diagnosis not present

## 2015-10-31 DIAGNOSIS — Z1211 Encounter for screening for malignant neoplasm of colon: Secondary | ICD-10-CM | POA: Diagnosis not present

## 2015-11-06 LAB — FECAL OCCULT BLOOD, GUAIAC: Fecal Occult Blood: NEGATIVE

## 2015-11-07 DIAGNOSIS — Z0000 Encounter for general adult medical examination without abnormal findings: Secondary | ICD-10-CM | POA: Diagnosis not present

## 2015-11-07 DIAGNOSIS — E785 Hyperlipidemia, unspecified: Secondary | ICD-10-CM | POA: Diagnosis not present

## 2015-11-07 DIAGNOSIS — Z125 Encounter for screening for malignant neoplasm of prostate: Secondary | ICD-10-CM | POA: Diagnosis not present

## 2015-11-08 DIAGNOSIS — Z1211 Encounter for screening for malignant neoplasm of colon: Secondary | ICD-10-CM | POA: Diagnosis not present

## 2016-09-01 DIAGNOSIS — W64XXXA Exposure to other animate mechanical forces, initial encounter: Secondary | ICD-10-CM | POA: Diagnosis not present

## 2016-09-01 DIAGNOSIS — J42 Unspecified chronic bronchitis: Secondary | ICD-10-CM | POA: Diagnosis not present

## 2016-09-23 DIAGNOSIS — Z961 Presence of intraocular lens: Secondary | ICD-10-CM | POA: Diagnosis not present

## 2016-09-23 DIAGNOSIS — H25812 Combined forms of age-related cataract, left eye: Secondary | ICD-10-CM | POA: Diagnosis not present

## 2016-09-23 DIAGNOSIS — H18413 Arcus senilis, bilateral: Secondary | ICD-10-CM | POA: Diagnosis not present

## 2016-11-04 DIAGNOSIS — Z0000 Encounter for general adult medical examination without abnormal findings: Secondary | ICD-10-CM | POA: Diagnosis not present

## 2017-01-05 DIAGNOSIS — H25812 Combined forms of age-related cataract, left eye: Secondary | ICD-10-CM | POA: Diagnosis not present

## 2017-02-03 NOTE — Patient Instructions (Signed)
Your procedure is scheduled on: 02/09/2017  Report to [facility] at  49  AM.  Call this number if you have problems the morning of surgery: [phone]   Do not eat food or drink liquids :After Midnight.      Take these medicines the morning of surgery with A SIP OF WATER: zyrtec, prednisone.   Do not wear jewelry, make-up or nail polish.  Do not wear lotions, powders, or perfumes. You may wear deodorant.  Do not shave 48 hours prior to surgery.  Do not bring valuables to the hospital.  Contacts, dentures or bridgework may not be worn into surgery.  Leave suitcase in the car. After surgery it may be brought to your room.  For patients admitted to the hospital, checkout time is 11:00 AM the day of discharge.   Patients discharged the day of surgery will not be allowed to drive home.  :     Please read over the following fact sheets that you were given: Coughing and Deep Breathing, Surgical Site Infection Prevention, Anesthesia Post-op Instructions and Care and Recovery After Surgery    Cataract A cataract is a clouding of the lens of the eye. When a lens becomes cloudy, vision is reduced based on the degree and nature of the clouding. Many cataracts reduce vision to some degree. Some cataracts make people more near-sighted as they develop. Other cataracts increase glare. Cataracts that are ignored and become worse can sometimes look white. The white color can be seen through the pupil. CAUSES   Aging. However, cataracts may occur at any age, even in newborns.   Certain drugs.   Trauma to the eye.   Certain diseases such as diabetes.   Specific eye diseases such as chronic inflammation inside the eye or a sudden attack of a rare form of glaucoma.   Inherited or acquired medical problems.  SYMPTOMS   Gradual, progressive drop in vision in the affected eye.   Severe, rapid visual loss. This most often happens when trauma is the cause.  DIAGNOSIS  To detect a cataract, an eye  doctor examines the lens. Cataracts are best diagnosed with an exam of the eyes with the pupils enlarged (dilated) by drops.  TREATMENT  For an early cataract, vision may improve by using different eyeglasses or stronger lighting. If that does not help your vision, surgery is the only effective treatment. A cataract needs to be surgically removed when vision loss interferes with your everyday activities, such as driving, reading, or watching TV. A cataract may also have to be removed if it prevents examination or treatment of another eye problem. Surgery removes the cloudy lens and usually replaces it with a substitute lens (intraocular lens, IOL).  At a time when both you and your doctor agree, the cataract will be surgically removed. If you have cataracts in both eyes, only one is usually removed at a time. This allows the operated eye to heal and be out of danger from any possible problems after surgery (such as infection or poor wound healing). In rare cases, a cataract may be doing damage to your eye. In these cases, your caregiver may advise surgical removal right away. The vast majority of people who have cataract surgery have better vision afterward. HOME CARE INSTRUCTIONS  If you are not planning surgery, you may be asked to do the following:  Use different eyeglasses.   Use stronger or brighter lighting.   Ask your eye doctor about reducing your medicine dose  or changing medicines if it is thought that a medicine caused your cataract. Changing medicines does not make the cataract go away on its own.   Become familiar with your surroundings. Poor vision can lead to injury. Avoid bumping into things on the affected side. You are at a higher risk for tripping or falling.   Exercise extreme care when driving or operating machinery.   Wear sunglasses if you are sensitive to bright light or experiencing problems with glare.  SEEK IMMEDIATE MEDICAL CARE IF:   You have a worsening or sudden  vision loss.   You notice redness, swelling, or increasing pain in the eye.   You have a fever.  Document Released: 02/17/2005 Document Revised: 02/06/2011 Document Reviewed: 10/11/2010 [facility] Patient Information 2012 [place].PATIENT INSTRUCTIONS POST-ANESTHESIA  IMMEDIATELY FOLLOWING SURGERY:  Do not drive or operate machinery for the first twenty four hours after surgery.  Do not make any important decisions for twenty four hours after surgery or while taking narcotic pain medications or sedatives.  If you develop intractable nausea and vomiting or a severe headache please notify your doctor immediately.  FOLLOW-UP:  Please make an appointment with your surgeon as instructed. You do not need to follow up with anesthesia unless specifically instructed to do so.  WOUND CARE INSTRUCTIONS (if applicable):  Keep a dry clean dressing on the anesthesia/puncture wound site if there is drainage.  Once the wound has quit draining you may leave it open to air.  Generally you should leave the bandage intact for twenty four hours unless there is drainage.  If the epidural site drains for more than 36-48 hours please call the anesthesia department.  QUESTIONS?:  Please feel free to call your physician or the hospital operator if you have any questions, and they will be happy to assist you.

## 2017-02-04 ENCOUNTER — Other Ambulatory Visit: Payer: Self-pay

## 2017-02-04 ENCOUNTER — Encounter (HOSPITAL_COMMUNITY): Payer: Self-pay

## 2017-02-04 ENCOUNTER — Encounter (HOSPITAL_COMMUNITY)
Admission: RE | Admit: 2017-02-04 | Discharge: 2017-02-04 | Disposition: A | Discharge status: Home / Self Care | Payer: Medicare Other | Source: Ambulatory Visit | Attending: Ophthalmology | Admitting: Ophthalmology

## 2017-02-04 DIAGNOSIS — Z01818 Encounter for other preprocedural examination: Secondary | ICD-10-CM | POA: Insufficient documentation

## 2017-02-04 DIAGNOSIS — Z01810 Encounter for preprocedural cardiovascular examination: Secondary | ICD-10-CM | POA: Diagnosis not present

## 2017-02-04 DIAGNOSIS — H2512 Age-related nuclear cataract, left eye: Secondary | ICD-10-CM | POA: Diagnosis not present

## 2017-02-04 DIAGNOSIS — I491 Atrial premature depolarization: Secondary | ICD-10-CM | POA: Insufficient documentation

## 2017-02-04 HISTORY — DX: Personal history of urinary calculi: Z87.442

## 2017-02-04 LAB — CBC WITH DIFFERENTIAL/PLATELET
Basophils Absolute: 0 10*3/uL (ref 0.0–0.1)
Basophils Relative: 0 %
Eosinophils Absolute: 0.8 10*3/uL — ABNORMAL HIGH (ref 0.0–0.7)
Eosinophils Relative: 7 %
HCT: 43.8 % (ref 39.0–52.0)
Hemoglobin: 14 g/dL (ref 13.0–17.0)
Lymphocytes Relative: 18 %
Lymphs Abs: 2.2 10*3/uL (ref 0.7–4.0)
MCH: 30.3 pg (ref 26.0–34.0)
MCHC: 32 g/dL (ref 30.0–36.0)
MCV: 94.8 fL (ref 78.0–100.0)
Monocytes Absolute: 1.2 10*3/uL — ABNORMAL HIGH (ref 0.1–1.0)
Monocytes Relative: 10 %
Neutro Abs: 8.2 10*3/uL — ABNORMAL HIGH (ref 1.7–7.7)
Neutrophils Relative %: 65 %
Platelets: 266 10*3/uL (ref 150–400)
RBC: 4.62 MIL/uL (ref 4.22–5.81)
RDW: 13.2 % (ref 11.5–15.5)
WBC: 12.4 10*3/uL — ABNORMAL HIGH (ref 4.0–10.5)

## 2017-03-09 MED ORDER — LIDOCAINE HCL 3.5 % OP GEL: 1.0000 "application " | Freq: Once | OPHTHALMIC | Status: DC

## 2017-03-09 MED ORDER — TETRACAINE HCL 0.5 % OP SOLN: 1.0000 [drp] | OPHTHALMIC | Status: DC

## 2017-03-09 MED ORDER — CYCLOPENTOLATE-PHENYLEPHRINE 0.2-1 % OP SOLN: 1.0000 [drp] | OPHTHALMIC | Status: DC

## 2017-03-09 MED ORDER — PHENYLEPHRINE HCL 2.5 % OP SOLN: 1.0000 [drp] | OPHTHALMIC | Status: DC

## 2017-03-10 ENCOUNTER — Encounter (HOSPITAL_COMMUNITY): Payer: Self-pay

## 2017-03-10 ENCOUNTER — Encounter (HOSPITAL_COMMUNITY)
Admission: RE | Admit: 2017-03-10 | Discharge: 2017-03-10 | Disposition: A | Discharge status: Home / Self Care | Payer: Medicare Other | Source: Ambulatory Visit | Attending: Ophthalmology | Admitting: Ophthalmology

## 2017-03-16 ENCOUNTER — Ambulatory Visit (HOSPITAL_COMMUNITY): Payer: Medicare Other | Admitting: Anesthesiology

## 2017-03-16 ENCOUNTER — Ambulatory Visit (HOSPITAL_COMMUNITY)
Admission: RE | Admit: 2017-03-16 | Discharge: 2017-03-16 | Disposition: A | Discharge status: Home / Self Care | Payer: Medicare Other | Source: Ambulatory Visit | Attending: Ophthalmology | Admitting: Ophthalmology

## 2017-03-16 ENCOUNTER — Encounter (HOSPITAL_COMMUNITY)
Admission: RE | Disposition: A | Discharge status: Home / Self Care | Payer: Self-pay | Source: Ambulatory Visit | Attending: Ophthalmology

## 2017-03-16 ENCOUNTER — Encounter (HOSPITAL_COMMUNITY): Payer: Self-pay

## 2017-03-16 DIAGNOSIS — Z79899 Other long term (current) drug therapy: Secondary | ICD-10-CM | POA: Insufficient documentation

## 2017-03-16 DIAGNOSIS — H2512 Age-related nuclear cataract, left eye: Secondary | ICD-10-CM | POA: Diagnosis not present

## 2017-03-16 DIAGNOSIS — Z77090 Contact with and (suspected) exposure to asbestos: Secondary | ICD-10-CM | POA: Diagnosis not present

## 2017-03-16 DIAGNOSIS — H25812 Combined forms of age-related cataract, left eye: Secondary | ICD-10-CM | POA: Diagnosis not present

## 2017-03-16 DIAGNOSIS — Z87891 Personal history of nicotine dependence: Secondary | ICD-10-CM | POA: Diagnosis not present

## 2017-03-16 DIAGNOSIS — J449 Chronic obstructive pulmonary disease, unspecified: Secondary | ICD-10-CM | POA: Diagnosis not present

## 2017-03-16 DIAGNOSIS — E7800 Pure hypercholesterolemia, unspecified: Secondary | ICD-10-CM | POA: Insufficient documentation

## 2017-03-16 HISTORY — PX: CATARACT EXTRACTION W/PHACO: SHX586

## 2017-03-16 LAB — POCT I-STAT 4, (NA,K, GLUC, HGB,HCT)
Glucose, Bld: 105 mg/dL — ABNORMAL HIGH (ref 65–99)
HCT: 45 % (ref 39.0–52.0)
Hemoglobin: 15.3 g/dL (ref 13.0–17.0)
Potassium: 5.2 mmol/L — ABNORMAL HIGH (ref 3.5–5.1)
Sodium: 136 mmol/L (ref 135–145)

## 2017-03-16 SURGERY — PHACOEMULSIFICATION, CATARACT, WITH IOL INSERTION
Anesthesia: Monitor Anesthesia Care | Site: Eye | Laterality: Left

## 2017-03-16 MED ORDER — CYCLOPENTOLATE-PHENYLEPHRINE 0.2-1 % OP SOLN
1.0000 [drp] | OPHTHALMIC | Status: AC
  Administered 2017-03-16 (×3): 1 [drp] via OPHTHALMIC

## 2017-03-16 MED ORDER — MIDAZOLAM HCL 2 MG/2ML IJ SOLN
INTRAMUSCULAR | Status: AC
  Filled 2017-03-16: qty 2

## 2017-03-16 MED ORDER — POVIDONE-IODINE 5 % OP SOLN
OPHTHALMIC | Status: DC | PRN
  Administered 2017-03-16: 1 via OPHTHALMIC

## 2017-03-16 MED ORDER — FENTANYL CITRATE (PF) 100 MCG/2ML IJ SOLN
INTRAMUSCULAR | Status: AC
  Filled 2017-03-16: qty 2

## 2017-03-16 MED ORDER — LACTATED RINGERS IV SOLN
INTRAVENOUS | Status: DC
  Administered 2017-03-16: 07:00:00 via INTRAVENOUS

## 2017-03-16 MED ORDER — FENTANYL CITRATE (PF) 100 MCG/2ML IJ SOLN
25.0000 ug | Freq: Once | INTRAMUSCULAR | Status: AC
  Administered 2017-03-16: 25 ug via INTRAVENOUS

## 2017-03-16 MED ORDER — PHENYLEPHRINE HCL 2.5 % OP SOLN
1.0000 [drp] | OPHTHALMIC | Status: AC
Start: 2017-03-16 — End: 2017-03-16
  Administered 2017-03-16 (×3): 1 [drp] via OPHTHALMIC

## 2017-03-16 MED ORDER — TETRACAINE HCL 0.5 % OP SOLN
1.0000 [drp] | OPHTHALMIC | Status: AC
Start: 2017-03-16 — End: 2017-03-16
  Administered 2017-03-16 (×3): 1 [drp] via OPHTHALMIC

## 2017-03-16 MED ORDER — LIDOCAINE HCL 3.5 % OP GEL
1.0000 "application " | Freq: Once | OPHTHALMIC | Status: AC
  Administered 2017-03-16: 1 via OPHTHALMIC

## 2017-03-16 MED ORDER — PROVISC 10 MG/ML IO SOLN
INTRAOCULAR | Status: DC | PRN
  Administered 2017-03-16: 0.85 mL via INTRAOCULAR

## 2017-03-16 MED ORDER — LIDOCAINE HCL (PF) 1 % IJ SOLN
INTRAOCULAR | Status: DC | PRN
  Administered 2017-03-16: .8 mL via OPHTHALMIC

## 2017-03-16 MED ORDER — MIDAZOLAM HCL 2 MG/2ML IJ SOLN
1.0000 mg | INTRAMUSCULAR | Status: AC
  Administered 2017-03-16: 2 mg via INTRAVENOUS

## 2017-03-16 MED ORDER — NEOMYCIN-POLYMYXIN-DEXAMETH 3.5-10000-0.1 OP SUSP
OPHTHALMIC | Status: DC | PRN
  Administered 2017-03-16: 2 [drp] via OPHTHALMIC

## 2017-03-16 MED ORDER — BSS IO SOLN
INTRAOCULAR | Status: DC | PRN
  Administered 2017-03-16: 15 mL

## 2017-03-16 MED ORDER — EPINEPHRINE PF 1 MG/ML IJ SOLN
INTRAOCULAR | Status: DC | PRN
  Administered 2017-03-16: 500 mL

## 2017-03-16 SURGICAL SUPPLY — 10 items
CLOTH BEACON ORANGE TIMEOUT ST (SAFETY) ×1 IMPLANT
EYE SHIELD UNIVERSAL CLEAR (GAUZE/BANDAGES/DRESSINGS) ×1 IMPLANT
GLOVE BIOGEL PI IND STRL 7.0 (GLOVE) IMPLANT
GLOVE BIOGEL PI INDICATOR 7.0 (GLOVE) ×2
LENS ALC ACRYL/TECN (Ophthalmic Related) ×1 IMPLANT
PAD ARMBOARD 7.5X6 YLW CONV (MISCELLANEOUS) ×1 IMPLANT
SYRINGE LUER LOK 1CC (MISCELLANEOUS) ×1 IMPLANT
TAPE SURG TRANSPORE 1 IN (GAUZE/BANDAGES/DRESSINGS) IMPLANT
TAPE SURGICAL TRANSPORE 1 IN (GAUZE/BANDAGES/DRESSINGS) ×1
WATER STERILE IRR 250ML POUR (IV SOLUTION) ×1 IMPLANT

## 2017-03-16 NOTE — Anesthesia Postprocedure Evaluation (Signed)
Anesthesia Post Note  Patient: Ryan Hanson  Procedure(s) Performed: CATARACT EXTRACTION PHACO AND INTRAOCULAR LENS PLACEMENT ([place]) (Left Eye)  Patient location during evaluation: Short Stay Anesthesia Type: MAC Level of consciousness: awake and alert Pain management: satisfactory to patient Vital Signs Assessment: post-procedure vital signs reviewed and stable Respiratory status: spontaneous breathing Cardiovascular status: stable Postop Assessment: no apparent nausea or vomiting Anesthetic complications: no     Last Vitals:  Vitals:   03/16/17 0706 03/16/17 0718  BP: (!) 154/79   Pulse:  80  Temp: 36.4 C   SpO2: 95%     Last Pain:  Vitals:   03/16/17 0706  TempSrc: Oral                 Jerelle Virden

## 2017-03-16 NOTE — Discharge Instructions (Signed)

## 2017-03-16 NOTE — Op Note (Signed)
Date of Admission: 03/16/2017  Date of Surgery: 03/16/2017  Pre-Op Dx: Cataract Left  Eye  Post-Op Dx: Senile Combined Cataract  Left  Eye,  Dx Code X48.016  Surgeon: Tonny Branch, M.D.  Assistants: None  Anesthesia: Topical with MAC  Indications: Painless, progressive loss of vision with compromise of daily activities.  Surgery: Cataract Extraction with Intraocular lens Implant Left Eye  Discription: The patient had dilating drops and viscous lidocaine placed into the Left eye in the pre-op holding area. After transfer to the operating room, a time out was performed. The patient was then prepped and draped. Beginning with a 91m blade a paracentesis port was made at the surgeon's 2 o'clock position. The anterior chamber was then filled with 1% non-preserved lidocaine. This was followed by filling the anterior chamber with Provisc.  A 2.465mkeratome blade was used to make a clear corneal incision at the temporal limbus.  A bent cystatome needle was used to create a continuous tear capsulotomy. Hydrodissection was performed with balanced salt solution on a Fine canula. The lens nucleus was then removed using the phacoemulsification handpiece. Residual cortex was removed with the I&A handpiece. The anterior chamber and capsular bag were refilled with Provisc. A posterior chamber intraocular lens was placed into the capsular bag with it's injector. The implant was positioned with the Kuglan hook. The Provisc was then removed from the anterior chamber and capsular bag with the I&A handpiece. Stromal hydration of the main incision and paracentesis port was performed with BSS on a Fine canula. The wounds were tested for leak which was negative. The patient tolerated the procedure well. There were no operative complications. The patient was then transferred to the recovery room in stable condition.  Complications: None  Specimen: None  EBL: None  Prosthetic device: Alcon AU00T0, power 20.5, SN  [id number]

## 2017-03-16 NOTE — Transfer of Care (Signed)
Immediate Anesthesia Transfer of Care Note  Patient: Ryan Hanson  Procedure(s) Performed: CATARACT EXTRACTION PHACO AND INTRAOCULAR LENS PLACEMENT ([facility]) (Left Eye)  Patient Location: Short Stay  Anesthesia Type:MAC  Level of Consciousness: awake and alert   Airway & Oxygen Therapy: Patient Spontanous Breathing  Post-op Assessment: Report given to RN and Post -op Vital signs reviewed and stable  Post vital signs: Reviewed and stable  Last Vitals:  Vitals:   03/16/17 0706 03/16/17 0718  BP: (!) 154/79   Pulse:  80  Temp: 36.4 C   SpO2: 95%     Last Pain:  Vitals:   03/16/17 0706  TempSrc: Oral         Complications: No apparent anesthesia complications

## 2017-03-16 NOTE — Anesthesia Preprocedure Evaluation (Signed)
Anesthesia Evaluation  Patient identified by MRN, date of birth, ID band Patient awake    Reviewed: Allergy & Precautions, NPO status , Patient's Chart, lab work & pertinent test results  Airway Mallampati: III  TM Distance: >3 FB Neck ROM: Full    Dental  (+) Teeth Intact   Pulmonary shortness of breath and with exertion, COPD (Asbestosis), former smoker,  Asbestos exposure   breath sounds clear to auscultation       Cardiovascular + DOE   Rhythm:Regular Rate:Normal     Neuro/Psych negative neurological ROS  negative psych ROS   GI/Hepatic negative GI ROS, Neg liver ROS,   Endo/Other  negative endocrine ROS  Renal/GU negative Renal ROS     Musculoskeletal   Abdominal   Peds  Hematology negative hematology ROS (+)   Anesthesia Other Findings   Reproductive/Obstetrics                             Anesthesia Physical Anesthesia Plan  ASA: III  Anesthesia Plan: MAC   Post-op Pain Management:    Induction: Intravenous  PONV Risk Score and Plan:   Airway Management Planned: Nasal Cannula  Additional Equipment:   Intra-op Plan:   Post-operative Plan:   Informed Consent: I have reviewed the patients History and Physical, chart, labs and discussed the procedure including the risks, benefits and alternatives for the proposed anesthesia with the patient or authorized representative who has indicated his/her understanding and acceptance.     Plan Discussed with:   Anesthesia Plan Comments:         Anesthesia Quick Evaluation

## 2017-03-16 NOTE — H&P (Signed)
I have reviewed the H&P, the patient was re-examined, and I have identified no interval changes in medical condition and plan of care since the history and physical of record  

## 2017-03-16 NOTE — Anesthesia Procedure Notes (Signed)
Procedure Name: MAC Date/Time: 03/16/2017 7:51 AM Performed by: Vista Deck, CRNA Pre-anesthesia Checklist: Patient identified, Emergency Drugs available, Suction available, Timeout performed and Patient being monitored Patient Re-evaluated:Patient Re-evaluated prior to induction Oxygen Delivery Method: Nasal Cannula

## 2017-03-17 ENCOUNTER — Encounter (HOSPITAL_COMMUNITY): Payer: Self-pay | Admitting: Ophthalmology

## 2017-04-20 DIAGNOSIS — Z961 Presence of intraocular lens: Secondary | ICD-10-CM | POA: Diagnosis not present

## 2017-11-05 DIAGNOSIS — Z0000 Encounter for general adult medical examination without abnormal findings: Secondary | ICD-10-CM | POA: Diagnosis not present

## 2017-11-06 DIAGNOSIS — E785 Hyperlipidemia, unspecified: Secondary | ICD-10-CM | POA: Diagnosis not present

## 2017-11-06 DIAGNOSIS — W64XXXA Exposure to other animate mechanical forces, initial encounter: Secondary | ICD-10-CM | POA: Diagnosis not present

## 2017-11-06 DIAGNOSIS — J42 Unspecified chronic bronchitis: Secondary | ICD-10-CM | POA: Diagnosis not present

## 2017-11-06 DIAGNOSIS — Z0000 Encounter for general adult medical examination without abnormal findings: Secondary | ICD-10-CM | POA: Diagnosis not present

## 2017-11-06 LAB — CBC AND DIFFERENTIAL
HCT: 43 (ref 41–53)
Hemoglobin: 14.7 (ref 13.5–17.5)
Platelets: 269 (ref 150–399)
WBC: 10

## 2017-11-06 LAB — BASIC METABOLIC PANEL
BUN: 14 (ref 4–21)
CO2: 30 — AB (ref 13–22)
Chloride: 102 (ref 99–108)
Creatinine: 1.2 (ref <=1.3)
Glucose: 102
Potassium: 4.2 (ref 3.4–5.3)
Sodium: 137 (ref 137–147)

## 2017-11-06 LAB — HEPATIC FUNCTION PANEL
ALT: 11 (ref 10–40)
AST: 15 (ref 14–40)
Alkaline Phosphatase: 82 (ref 25–125)

## 2017-11-06 LAB — COMPREHENSIVE METABOLIC PANEL
Albumin: 4 (ref 3.5–5.0)
Calcium: 9.3 (ref 8.7–10.7)
GFR calc Af Amer: 67
GFR calc non Af Amer: 58
Globulin: 1.7

## 2017-11-06 LAB — LIPID PANEL
Cholesterol: 160 (ref 0–200)
HDL: 62 (ref 35–70)
LDL Cholesterol: 80
Triglycerides: 95 (ref 40–160)

## 2017-11-06 LAB — PSA: PSA: 1.4

## 2017-11-06 LAB — TSH: TSH: 2.76 (ref <=5.90)

## 2017-11-06 LAB — CBC: RBC: 4.79 (ref 3.87–5.11)

## 2017-11-23 DIAGNOSIS — D229 Melanocytic nevi, unspecified: Secondary | ICD-10-CM | POA: Diagnosis not present

## 2017-11-23 DIAGNOSIS — L720 Epidermal cyst: Secondary | ICD-10-CM | POA: Diagnosis not present

## 2017-11-23 DIAGNOSIS — L821 Other seborrheic keratosis: Secondary | ICD-10-CM | POA: Diagnosis not present

## 2017-12-25 DIAGNOSIS — Z23 Encounter for immunization: Secondary | ICD-10-CM | POA: Diagnosis not present

## 2018-01-25 DIAGNOSIS — E785 Hyperlipidemia, unspecified: Secondary | ICD-10-CM | POA: Diagnosis not present

## 2018-01-25 DIAGNOSIS — J42 Unspecified chronic bronchitis: Secondary | ICD-10-CM | POA: Diagnosis not present

## 2018-11-09 DIAGNOSIS — Z23 Encounter for immunization: Secondary | ICD-10-CM | POA: Diagnosis not present

## 2018-11-09 DIAGNOSIS — Z0000 Encounter for general adult medical examination without abnormal findings: Secondary | ICD-10-CM | POA: Diagnosis not present

## 2018-11-10 DIAGNOSIS — Z0000 Encounter for general adult medical examination without abnormal findings: Secondary | ICD-10-CM | POA: Diagnosis not present

## 2018-11-10 DIAGNOSIS — J42 Unspecified chronic bronchitis: Secondary | ICD-10-CM | POA: Diagnosis not present

## 2018-11-10 DIAGNOSIS — E785 Hyperlipidemia, unspecified: Secondary | ICD-10-CM | POA: Diagnosis not present

## 2018-11-11 LAB — COMPREHENSIVE METABOLIC PANEL
ALBUMIN/GLOBULIN RATIO: 2.2
ALT: 15 (ref 3–30)
AST: 14
Albumin: 3.9
Alkaline Phosphatase: 96
BUN: 15 (ref 4–21)
Calcium: 9
Carbon Dioxide, Total: 31
Chloride: 103
Creat: 1.06
EGFR (African American): 76
EGFR (Non-African Amer.): 65
Globulin: 1.8
Glucose: 92
Potassium: 4.4
Sodium: 140
Total Bilirubin: 0.7
Total Protein: 5.7 — AB (ref 6.4–8.2)

## 2018-11-11 LAB — CBC
Absolute Monocytes: 1091
Globulin: 1.8
Total Protein: 5.7 — AB (ref 6.4–8.2)

## 2018-11-11 LAB — LIPID PANEL
Chol/HDL Ratio: 2.2
Cholesterol, Total: 157
HDL Cholesterol: 73 — AB (ref 35–70)
LDL Cholesterol: 69
Non HDL Cholesterol: 84
Triglycerides: 69 (ref 40–160)

## 2018-11-11 LAB — TSH: TSH: 1.99

## 2019-01-28 DIAGNOSIS — W64XXXA Exposure to other animate mechanical forces, initial encounter: Secondary | ICD-10-CM | POA: Insufficient documentation

## 2019-01-28 DIAGNOSIS — E785 Hyperlipidemia, unspecified: Secondary | ICD-10-CM | POA: Insufficient documentation

## 2019-01-28 DIAGNOSIS — R2241 Localized swelling, mass and lump, right lower limb: Secondary | ICD-10-CM | POA: Insufficient documentation

## 2019-01-28 DIAGNOSIS — J42 Unspecified chronic bronchitis: Secondary | ICD-10-CM | POA: Insufficient documentation

## 2019-02-14 ENCOUNTER — Other Ambulatory Visit: Payer: Self-pay

## 2019-02-14 NOTE — Patient Outreach (Signed)
[facility]) Care Management  02/14/2019  JESI BOSSERMAN 1935-08-08 [id number]   Medication Adherence call to Mr. Gwenith Spitz [facility] Identifiers Verify spoke with patient he is past due on Atorvastatin 20 mg,patient explain he is only taking 1/2 tablet daily instead of 1 tablet daily,per doctors instructions,patient explain 10 mg is working for him,patient has plenty at this time and will order when due.  [place] Management Direct Dial [phone]  Fax [phone] [email or web address]

## 2019-02-23 ENCOUNTER — Other Ambulatory Visit: Payer: Self-pay

## 2019-02-23 ENCOUNTER — Ambulatory Visit (INDEPENDENT_AMBULATORY_CARE_PROVIDER_SITE_OTHER): Payer: Medicare Other | Admitting: Family Medicine

## 2019-02-23 ENCOUNTER — Encounter: Payer: Self-pay | Admitting: Family Medicine

## 2019-02-23 VITALS — BP 139/79 | HR 94 | Temp 97.6°F | Ht 69.0 in | Wt 185.0 lb

## 2019-02-23 DIAGNOSIS — J42 Unspecified chronic bronchitis: Secondary | ICD-10-CM

## 2019-02-23 DIAGNOSIS — J392 Other diseases of pharynx: Secondary | ICD-10-CM

## 2019-02-23 DIAGNOSIS — E785 Hyperlipidemia, unspecified: Secondary | ICD-10-CM | POA: Diagnosis not present

## 2019-02-23 NOTE — Progress Notes (Signed)
New Patient Office Visit  Subjective:  Patient ID: Ryan Hanson, male    DOB: [birth year]  Age: 83 y.o. MRN: [id number]  CC:  Chief Complaint  Patient presents with  . Establish Care  hyperlipidemia/chronic cough  HPI Ryan Hanson presents for heart murmur-sees cardiologist Chronic cough-followed by pulmonary previously-no inhalers Both ENT and allergist-evaluation-3 to 4 years ago due to cough, pulmonary test-2 years ago-pt taking prednisone and Levaquin DAILY for the last year. Pharmacist confirmed at least 10days/month over the last year pt has taken both medications. Pt states given by Dr. Luan Pulling for chronic cough. Pt states sticking , fullness sensation in the throat. NO difficulty with swallowing food or liquid. Pt states ENT scoped with no obstruction noted. Allergist recommended zyrtec.     Past Medical History:  Diagnosis Date  . Allergy   . Asbestos exposure   . Cataract   . Chronic cough   . Dyspnea    chronic  . Exposure to other animate mechanical forces, initial encounter   . Heart murmur   . History of kidney stones   . Hyperlipidemia   . Hyperlipidemia, unspecified   . Localized swelling, mass and lump, right lower limb   . Unspecified chronic bronchitis ([place])     Past Surgical History:  Procedure Laterality Date  . CATARACT EXTRACTION W/PHACO Right 03/19/2015   Procedure: CATARACT EXTRACTION PHACO AND INTRAOCULAR LENS PLACEMENT; CDE:  9.70;  Surgeon: Williams Che, MD;  Location: [facility];  Service: Ophthalmology;  Laterality: Right;  . CATARACT EXTRACTION W/PHACO Left 03/16/2017   Procedure: CATARACT EXTRACTION PHACO AND INTRAOCULAR LENS PLACEMENT ([facility]);  Surgeon: Tonny Branch, MD;  Location: [facility];  Service: Ophthalmology;  Laterality: Left;  CDE: 26.41  . EYE SURGERY      Family History  Problem Relation Age of Onset  . Cancer Mother     Social History   Socioeconomic History  . Marital status: Widowed    Spouse name: Not on file  .  Number of children: Not on file  . Years of education: Not on file  . Highest education level: Not on file  Occupational History  . Not on file  Tobacco Use  . Smoking status: Former Smoker    Packs/day: 2.00    Years: 30.00    Pack years: 60.00    Types: Cigarettes    Quit date: 03/12/1978    Years since quitting: 40.9  . Smokeless tobacco: Never Used  Substance and Sexual Activity  . Alcohol use: No  . Drug use: No  . Sexual activity: Not Currently    Birth control/protection: None  Other Topics Concern  . Not on file  Social History Narrative  . Not on file   Social Determinants of Health   Financial Resource Strain:   . Difficulty of Paying Living Expenses: Not on file  Food Insecurity:   . Worried About Charity fundraiser in the Last Year: Not on file  . Ran Out of Food in the Last Year: Not on file  Transportation Needs:   . Lack of Transportation (Medical): Not on file  . Lack of Transportation (Non-Medical): Not on file  Physical Activity:   . Days of Exercise per Week: Not on file  . Minutes of Exercise per Session: Not on file  Stress:   . Feeling of Stress : Not on file  Social Connections:   . Frequency of Communication with Friends and Family: Not on file  . Frequency  of Social Gatherings with Friends and Family: Not on file  . Attends Religious Services: Not on file  . Active Member of Clubs or Organizations: Not on file  . Attends Archivist Meetings: Not on file  . Marital Status: Not on file  Intimate Partner Violence:   . Fear of Current or Ex-Partner: Not on file  . Emotionally Abused: Not on file  . Physically Abused: Not on file  . Sexually Abused: Not on file    ROS Review of Systems  Constitutional: Negative.   HENT: Positive for congestion, hearing loss and sneezing.   Eyes: Positive for itching.       Eye doctor 9/20-annual exam Eye drops due to dry eye  Respiratory: Positive for cough. Negative for choking, chest  tightness, shortness of breath, wheezing and stridor.   Cardiovascular:       Heart murmur  Gastrointestinal: Negative.   Endocrine: Negative.   Genitourinary: Negative.   Musculoskeletal: Negative.   Skin: Negative.   Allergic/Immunologic: Positive for environmental allergies.       Allergy and asthma evaluation 3 years ago-intermittent -used prednisone given by pulmonary-no recent pulmonary function test. Pt uses zyrtec. Recurrent  Neurological: Negative.   Hematological: Bruises/bleeds easily.  Psychiatric/Behavioral: Negative.     Objective:   Today's Vitals: BP 139/79 (BP Location: Right Arm, Patient Position: Sitting, Cuff Size: Normal)   Pulse 94   Temp 97.6 F (36.4 C) (Oral)   Ht 5\' 9"  (1.753 m)   Wt 185 lb (83.9 kg)   SpO2 96%   BMI 27.32 kg/m   Physical Exam Vitals reviewed.  Constitutional:      Appearance: Normal appearance.  HENT:     Head: Normocephalic and atraumatic.     Right Ear: Tympanic membrane, ear canal and external ear normal.     Left Ear: Tympanic membrane, ear canal and external ear normal.     Nose: Nose normal.     Mouth/Throat:     Mouth: Mucous membranes are dry.  Eyes:     Pupils: Pupils are equal, round, and reactive to light.  Cardiovascular:     Rate and Rhythm: Normal rate and regular rhythm.     Pulses: Normal pulses.     Heart sounds: Normal heart sounds.  Pulmonary:     Effort: Pulmonary effort is normal.     Breath sounds: Normal breath sounds.  Musculoskeletal:     Cervical back: Normal range of motion and neck supple.  Neurological:     Mental Status: He is alert and oriented to person, place, and time.  Psychiatric:        Mood and Affect: Mood normal.        Behavior: Behavior normal.     Assessment & Plan:  1. Chronic bronchitis, unspecified chronic bronchitis type ([place]) Pt taking Levofloxin DAILY given by Dr. Luan Pulling. Pt taking prednisone daily-d/w pt can not rx prednisone daily-would need specialist to  determine need for ongoing use of prednisone and antibiotics-pt acknowledged understanding of concerns and will keep appointment to discuss with pulmonary.  - Ambulatory referral to Pulmonology Outpatient Encounter Medications as of 02/23/2019  Medication Sig  . cholecalciferol (VITAMIN D) 1000 units tablet Take 1,000 Units by mouth daily.  Marland Kitchen LEVOFLOXACIN PO Take 500 mg by mouth daily.  . [DISCONTINUED] predniSONE (DELTASONE) 1 MG tablet Take 4 mg by mouth daily as needed (BREATHING PROBLEMS).   Marland Kitchen acetaminophen (TYLENOL) 500 MG tablet Take 1,000 mg by mouth daily.  Marland Kitchen  atorvastatin (LIPITOR) 20 MG tablet Take 10 mg by mouth daily.  . cetirizine (ZYRTEC) 10 MG tablet Take 10 mg by mouth daily.  Marland Kitchen triamcinolone cream (KENALOG) 0.1 % Apply 1 application topically 2 (two) times daily.   No facility-administered encounter medications on file as of 02/23/2019.   2. Swelling of throat Pt with sensation of swelling in the throat-ENT evaluation-no difficulty swallowing food or water.  Scope yielded no obstruction. Allergy evaluation-3 years ago-allergy to dust, mildew-pt has been taking prednisone DAILY for a year-given by previous pulmonary specialist.   3. Hyperlipidemia, unspecified hyperlipidemia type lipitor -lipid panel , cmp -fasting Follow-up: allergist/pulmonary specialist   Deazia Lampi Hannah Beat, MD

## 2019-02-23 NOTE — Patient Instructions (Addendum)
Allergy referral  Pulmonary referral  labwork yearly-fasting for lipid panel Yearly follow-up for aortic sclerosis- with [facility]

## 2019-02-24 DIAGNOSIS — J392 Other diseases of pharynx: Secondary | ICD-10-CM | POA: Insufficient documentation

## 2019-02-24 HISTORY — DX: Other diseases of pharynx: J39.2

## 2019-02-24 NOTE — Addendum Note (Signed)
Addended by: Maryruth Hancock on: 02/24/2019 02:44 PM   Modules accepted: Orders

## 2019-03-10 ENCOUNTER — Ambulatory Visit (INDEPENDENT_AMBULATORY_CARE_PROVIDER_SITE_OTHER): Payer: Medicare Other

## 2019-03-10 ENCOUNTER — Other Ambulatory Visit: Payer: Self-pay

## 2019-03-10 ENCOUNTER — Ambulatory Visit: Payer: Medicare Other | Admitting: Pulmonary Disease

## 2019-03-10 ENCOUNTER — Encounter: Payer: Self-pay | Admitting: Pulmonary Disease

## 2019-03-10 DIAGNOSIS — Z77090 Contact with and (suspected) exposure to asbestos: Secondary | ICD-10-CM | POA: Diagnosis not present

## 2019-03-10 DIAGNOSIS — G4733 Obstructive sleep apnea (adult) (pediatric): Secondary | ICD-10-CM

## 2019-03-10 DIAGNOSIS — J42 Unspecified chronic bronchitis: Secondary | ICD-10-CM | POA: Diagnosis not present

## 2019-03-10 DIAGNOSIS — J9811 Atelectasis: Secondary | ICD-10-CM | POA: Diagnosis not present

## 2019-03-10 HISTORY — DX: Obstructive sleep apnea (adult) (pediatric): G47.33

## 2019-03-10 MED ORDER — ALBUTEROL SULFATE HFA 108 (90 BASE) MCG/ACT IN AERS: 2.0000 | INHALATION_SPRAY | Freq: Four times a day (QID) | RESPIRATORY_TRACT | 6 refills | Status: DC | PRN

## 2019-03-10 MED ORDER — METHYLPREDNISOLONE 2 MG PO TABS: 2.0000 mg | ORAL_TABLET | Freq: Every day | ORAL | 1 refills | Status: DC

## 2019-03-10 NOTE — Assessment & Plan Note (Addendum)
He has been maintained on 4 mg of Medrol for more than 10 years. I have advised him to stop taking Levaquin since that he does not have any evidence of bronchiectasis on his imaging studies.  We will continue on 2 mg of Medrol for 3 months and if he stays stable and does not have a flareup of allergies in the spring then we will attempt to taper him off completely  We discussed long-term side effects of steroids.  We also discussed problems associated with suddenly discontinuing steroids/adrenal insufficiency  Spirometry in the future. Use albuterol as needed

## 2019-03-10 NOTE — Assessment & Plan Note (Signed)
He likely has undiagnosed OSA given his episodes of choking in his sleep. I offered mom sleep study, he would like to hold off for now we will discuss again in the future  The pathophysiology of obstructive sleep apnea , it's cardiovascular consequences & modes of treatment including CPAP were discused with the patient in detail & they evidenced understanding.

## 2019-03-10 NOTE — Assessment & Plan Note (Addendum)
Obtain chest x-ray for follow-up today >> This again shows stable bilateral calcified pleural plaques and left basal atelectasis If he develops symptoms may have to consider CT imaging to rule out asbestosis

## 2019-03-10 NOTE — Progress Notes (Signed)
Subjective:    Patient ID: Ryan Hanson, male    DOB: 10/16/1935, 84 y.o.   MRN: [id number]  HPI   Chief Complaint  Patient presents with  . Consult    Patient is here for shortness of breath and cough at night. Patient has trouble laying flat so has been in recliner. Patient takes Prednisone everyday.     84 year old remote smoker presents for evaluation of chronic bronchitis and need for prednisone. He has been seeing Dr. Maryjean Ka in [place] and would like to establish with [place]. He reports an episode of bad cough after a road trip out [place] in 2009, underwent evaluation by ENT which was negative and then was given prednisone and Levaquin.  For some reason this prescription was continued indefinitely and he states that lately since 2016 he has been taking daily Medrol 4 mg and Levaquin.  He was seen by his PCP a few months ago and noted that he was on chronic steroids and hence referred to [place]. He denies frequent chest colds, in fact his last episode bronchitis was in 2016 He reports occasional shortness of breath although he states that he can get on a treadmill and walk for about 20 minutes. At night he reports choking episodes that wake him up from sleep and he has to sleep in a recliner.  This episodes especially occur when he lies on his back.  He has been woken up by his wife in the past due to snoring There is no history suggestive of cataplexy, sleep paralysis or parasomnias He reports some excessive daytime somnolence.  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  He reports exposure to synthetic fiber such as nylons.  He also worked in [employer] for 18 months. 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 Allergy   . Asbestos exposure   . Cataract   . Chronic cough   . Dyspnea    chronic  . Exposure to other animate mechanical forces, initial encounter   . Heart murmur   . History of kidney stones   . Hyperlipidemia   . Hyperlipidemia, unspecified   . Localized swelling, mass and lump, right lower limb   . Unspecified chronic bronchitis ([place])     Past Surgical History:  Procedure Laterality Date  . CATARACT EXTRACTION W/PHACO Right 03/19/2015   Procedure: CATARACT EXTRACTION PHACO AND INTRAOCULAR LENS PLACEMENT; CDE:  9.70;  Surgeon: Williams Che, MD;  Location: [facility];  Service: Ophthalmology;  Laterality: Right;  . CATARACT EXTRACTION W/PHACO Left 03/16/2017   Procedure: CATARACT EXTRACTION PHACO AND INTRAOCULAR LENS PLACEMENT (IOC);  Surgeon: Tonny Branch, MD;  Location: [facility];  Service: Ophthalmology;  Laterality: Left;  CDE: 26.41  . EYE SURGERY      Allergies  Allergen Reactions  . Ceftin [Cefuroxime Axetil]     Social History   Socioeconomic History  . Marital status: Widowed    Spouse name: Not on file  . Number of children: Not on file  . Years of education: Not on file  . Highest education level: Not on file  Occupational History  . Not on file  Tobacco Use  .  Smoking status: Former Smoker    Packs/day: 2.00    Years: 30.00    Pack years: 60.00    Types: Cigarettes    Quit date: 03/12/1978    Years since quitting: 41.0  . Smokeless tobacco: Never Used  Substance and Sexual Activity  . Alcohol use: No  . Drug use: No  . Sexual activity: Not Currently    Birth control/protection: None  Other Topics Concern  . Not on file  Social History Narrative  . Not on file   Social Determinants of Health   Financial Resource Strain:   . Difficulty of Paying Living Expenses: Not on file  Food Insecurity:   . Worried About Charity fundraiser in the Last Year: Not on file  . Ran Out of Food in the  Last Year: Not on file  Transportation Needs:   . Lack of Transportation (Medical): Not on file  . Lack of Transportation (Non-Medical): Not on file  Physical Activity:   . Days of Exercise per Week: Not on file  . Minutes of Exercise per Session: Not on file  Stress:   . Feeling of Stress : Not on file  Social Connections:   . Frequency of Communication with Friends and Family: Not on file  . Frequency of Social Gatherings with Friends and Family: Not on file  . Attends Religious Services: Not on file  . Active Member of Clubs or Organizations: Not on file  . Attends Archivist Meetings: Not on file  . Marital Status: Not on file  Intimate Partner Violence:   . Fear of Current or Ex-Partner: Not on file  . Emotionally Abused: Not on file  . Physically Abused: Not on file  . Sexually Abused: Not on file    Family History  Problem Relation Age of Onset  . Cancer Mother      Review of Systems Constitutional: negative for anorexia, fevers and sweats  Eyes: negative for irritation, redness and visual disturbance  Ears, nose, mouth, throat, and face: negative for earaches, epistaxis, nasal congestion and sore throat  Respiratory: negative for cough, dyspnea on exertion, sputum and wheezing  Cardiovascular: negative for chest pain, dyspnea, lower extremity edema, orthopnea, palpitations and syncope  Gastrointestinal: negative for abdominal pain, constipation, diarrhea, melena, nausea and vomiting  Genitourinary:negative for dysuria, frequency and hematuria  Hematologic/lymphatic: negative for bleeding, easy bruising and lymphadenopathy  Musculoskeletal:negative for arthralgias, muscle weakness and stiff joints  Neurological: negative for coordination problems, gait problems, headaches and weakness  Endocrine: negative for diabetic symptoms including polydipsia, polyuria and weight loss     Objective:   Physical Exam   Gen. Pleasant, well-nourished, in no distress,  normal affect ENT - no pallor,icterus, no post nasal drip Neck: No JVD, no thyromegaly, no carotid bruits Lungs: no use of accessory muscles, no dullness to percussion, clear without rales or rhonchi  Cardiovascular: Rhythm regular, heart sounds  normal, no murmurs or gallops, no peripheral edema Abdomen: soft and non-tender, no hepatosplenomegaly, BS normal. Musculoskeletal: No deformities, no cyanosis or clubbing Neuro:  alert, non focal        Assessment & Plan:

## 2019-03-10 NOTE — Patient Instructions (Signed)
Chest x-ray today.  Prescription for albuterol MDI 2 puffs every 6 hours as needed for shortness of breath or wheezing. Prescription for Medrol 2 mg daily -stay at this dose until we meet again

## 2019-03-23 ENCOUNTER — Ambulatory Visit: Payer: Medicare Other | Admitting: Allergy & Immunology

## 2019-03-23 ENCOUNTER — Other Ambulatory Visit: Payer: Self-pay

## 2019-03-23 ENCOUNTER — Encounter: Payer: Self-pay | Admitting: Allergy & Immunology

## 2019-03-23 VITALS — BP 124/74 | HR 88 | Temp 98.2°F | Resp 18 | Ht 66.0 in | Wt 184.0 lb

## 2019-03-23 DIAGNOSIS — J4540 Moderate persistent asthma, uncomplicated: Secondary | ICD-10-CM | POA: Diagnosis not present

## 2019-03-23 DIAGNOSIS — J3089 Other allergic rhinitis: Secondary | ICD-10-CM | POA: Diagnosis not present

## 2019-03-23 DIAGNOSIS — J4550 Severe persistent asthma, uncomplicated: Secondary | ICD-10-CM | POA: Insufficient documentation

## 2019-03-23 DIAGNOSIS — F1920 Other psychoactive substance dependence, uncomplicated: Secondary | ICD-10-CM | POA: Diagnosis not present

## 2019-03-23 DIAGNOSIS — R899 Unspecified abnormal finding in specimens from other organs, systems and tissues: Secondary | ICD-10-CM | POA: Diagnosis not present

## 2019-03-23 DIAGNOSIS — J302 Other seasonal allergic rhinitis: Secondary | ICD-10-CM | POA: Diagnosis not present

## 2019-03-23 HISTORY — DX: Moderate persistent asthma, uncomplicated: J45.40

## 2019-03-23 NOTE — Patient Instructions (Addendum)
1. Mild intermittent asthma, uncomplicated - Lung testing looked bad today, but it did improve with the Xopenex inhaler. - Continue with the Medrol 2mg  daily. - Continue with albuterol 2 puffs every 6 hours. - I will route my note to Dr. Elsworth Soho to keep him in the loop. - We are going to start you on a daily controller medication (which includes an inhaled steroid and a long acting form of albuterol). - I am guessing that your breathing worsened once you decreased your Medrol dosing from 4mg  to 2mg . - We are going to get some lab work in case we need to start a biologic (injectable drug for asthma). - Spacer sample and demonstration provided. - Daily controller medication(s): Symbicort 80/4.48mcg two puffs twice daily with spacer - Prior to physical activity: albuterol 2 puffs 10-15 minutes before physical activity. - Rescue medications: albuterol 4 puffs every 4-6 hours as needed - Asthma control goals:  * Full participation in all desired activities (may need albuterol before activity) * Albuterol use two time or less a week on average (not counting use with activity) * Cough interfering with sleep two time or less a month * Oral steroids no more than once a year * No hospitalizations  2. Seasonal and perennial allergic rhinitis - Testing today showed: indoor molds, outdoor molds, dust mites and dog - Copy of test results provided.  - Avoidance measures provided. - Continue with: Zyrtec (cetirizine) 10mg  tablet once daily - Start taking: Flonase (fluticasone) one spray per nostril daily (aim for the nose) - You can use an extra dose of the antihistamine, if needed, for breakthrough symptoms.  - Consider nasal saline rinses 1-2 times daily to remove allergens from the nasal cavities as well as help with mucous clearance (this is especially helpful to do before the nasal sprays are given) - Consider allergy shots as a means of long-term control. - Allergy shots "re-train" and "reset" the  immune system to ignore environmental allergens and decrease the resulting immune response to those allergens (sneezing, itchy watery eyes, runny nose, nasal congestion, etc).    - Allergy shots improve symptoms in 75-85% of patients.  - We can discuss more at the next appointment if the medications are not working for you.  3. Return in about 4 weeks (around 04/20/2019). This can be an in-person, a virtual Webex or a telephone follow up visit.   Please inform us of any Emergency Department visits, hospitalizations, or changes in symptoms. Call us before going to the ED for breathing or allergy symptoms since we might be able to fit you in for a sick visit. Feel free to contact us anytime with any questions, problems, or concerns.  It was a pleasure to meet you today!  Websites that have reliable patient information: 1. American Academy of Asthma, Allergy, and Immunology: www.aaaai.org 2. Food Allergy Research and Education (FARE): foodallergy.org 3. Mothers of Asthmatics: http://www.asthmacommunitynetwork.org 4. American College of Allergy, Asthma, and Immunology: www.acaai.org   COVID-19 Vaccine Information can be found at: [email or web address].[email or web address].uk For questions related to vaccine distribution or appointments, please email [email or web address] or call [phone].     "Like" [place] on Facebook and Instagram for our latest updates!        Make sure you are registered to vote! If you have moved or changed any of your contact information, you will need to get this updated before voting!  In some cases, you MAY be able to register to vote online: [email or web address]    Control of Mold  Allergen   Mold and fungi can grow on a variety of surfaces provided certain temperature and moisture conditions exist.  Outdoor molds grow on plants, decaying vegetation and soil.  The major outdoor mold, Alternaria and  Cladosporium, are found in very high numbers during hot and dry conditions.  Generally, a late Summer - Fall peak is seen for common outdoor fungal spores.  Rain will temporarily lower outdoor mold spore count, but counts rise rapidly when the rainy period ends.  The most important indoor molds are Aspergillus and Penicillium.  Dark, humid and poorly ventilated basements are ideal sites for mold growth.  The next most common sites of mold growth are the bathroom and the kitchen.  Outdoor (Seasonal) Mold Control  Positive outdoor molds via skin testing: Alternaria, Cladosporium, Bipolaris (Helminthsporium), Drechslera (Curvalaria) and Mucor  1. Use air conditioning and keep windows closed 2. Avoid exposure to decaying vegetation. 3. Avoid leaf raking. 4. Avoid grain handling. 5. Consider wearing a face mask if working in moldy areas.  6.   Indoor (Perennial) Mold Control   Positive indoor molds via skin testing: Aspergillus and Penicillium  1. Maintain humidity below 50%. 2. Clean washable surfaces with 5% bleach solution. 3. Remove sources e.g. contaminated carpets.     Control of Dog or Cat Allergen  Avoidance is the best way to manage a dog or cat allergy. If you have a dog or cat and are allergic to dog or cats, consider removing the dog or cat from the home. If you have a dog or cat but don't want to find it a new home, or if your family wants a pet even though someone in the household is allergic, here are some strategies that may help keep symptoms at bay:  1. Keep the pet out of your bedroom and restrict it to only a few rooms. Be advised that keeping the dog or cat in only one room will not limit the allergens to that room. 2. Don't pet, hug or kiss the dog or cat; if you do, wash your hands with soap and water. 3. High-efficiency particulate air (HEPA) cleaners run continuously in a bedroom or living room can reduce allergen levels over time. 4. Regular use of a  high-efficiency vacuum cleaner or a central vacuum can reduce allergen levels. 5. Giving your dog or cat a bath at least once a week can reduce airborne allergen.  Control of Dust Mite Allergen    Dust mites play a major role in allergic asthma and rhinitis.  They occur in environments with high humidity wherever human skin is found.  Dust mites absorb humidity from the atmosphere (ie, they do not drink) and feed on organic matter (including shed human and animal skin).  Dust mites are a microscopic type of insect that you cannot see with the naked eye.  High levels of dust mites have been detected from mattresses, pillows, carpets, upholstered furniture, bed covers, clothes, soft toys and any woven material.  The principal allergen of the dust mite is found in its feces.  A gram of dust may contain 1,000 mites and 250,000 fecal particles.  Mite antigen is easily measured in the air during house cleaning activities.  Dust mites do not bite and do not cause harm to humans, other than by triggering allergies/asthma.    Ways to decrease your exposure to dust mites in your home:  1. Encase mattresses, box springs and pillows with a mite-impermeable barrier or cover   2. Wash  sheets, blankets and drapes weekly in hot water (130 F) with detergent and dry them in a dryer on the hot setting.  3. Have the room cleaned frequently with a vacuum cleaner and a damp dust-mop.  For carpeting or rugs, vacuuming with a vacuum cleaner equipped with a high-efficiency particulate air (HEPA) filter.  The dust mite allergic individual should not be in a room which is being cleaned and should wait 1 hour after cleaning before going into the room. 4. Do not sleep on upholstered furniture (eg, couches).   5. If possible removing carpeting, upholstered furniture and drapery from the home is ideal.  Horizontal blinds should be eliminated in the rooms where the person spends the most time (bedroom, study, television room).   Washable vinyl, roller-type shades are optimal. 6. Remove all non-washable stuffed toys from the bedroom.  Wash stuffed toys weekly like sheets and blankets above.   7. Reduce indoor humidity to less than 50%.  Inexpensive humidity monitors can be purchased at most hardware stores.  Do not use a humidifier as can make the problem worse and are not recommended.

## 2019-03-23 NOTE — Progress Notes (Addendum)
NEW PATIENT  Date of Service/Encounter:  03/23/19  Referring provider: Maryruth Hancock, MD   Assessment:   Moderate persistent asthma without complication  Seasonal and perennial allergic rhinitis  Steroid dependence - gets regular bone scans and eye exams  Elevated absolute eosinophil count of 800 (September 2019) - consider ANCAs at the next visit?     Mr. Blaize is a wonderful 84 year old gentleman presenting for establishment of care.  He has a longstanding history of moderate persistent asthma and on testing today did show some sensitizations to some molds, dust mites, and dog on intradermal testing.  Although the results were not robust, I conjecture that his chronic steroid use likely decreases some of the reactivity.  Regardless, we did provide him with avoidance measures and started Flonase in addition to his Zyrtec.  His breathing was another matter.  We did get a spirometry which showed values in the 50% range.  This did improve markedly with 4 puffs of Xopenex.  His wheezing also improved.  It seems that he has been using his albuterol on a scheduled basis since seeing Dr. Elsworth Soho.  And certainly seems that he needs that bronchodilator activity.  When I first listened to him he sounded fine, but later during the exam he was certainly wheezing.  We did start Symbicort 80/4.5 mcg 2 puffs twice daily since we had samples of this.  He reported 24 hours later that he was getting some efficacy from this.  We are going to get some lab work since he does have a history of an elevated absolute eosinophil count.  I think one of the anti-IL-5 agents could prove useful in helping him to get off of steroids in the long-term.  We could consider referral to an endocrinologist for helping with the steroid wean if needed.  Plan/Recommendations:   1. Mild intermittent asthma, uncomplicated - Lung testing looked bad today, but it did improve with the Xopenex inhaler. - Continue with the Medrol 2mg   daily. - Continue with albuterol 2 puffs every 6 hours. - I will route my note to Dr. Elsworth Soho to keep him in the loop. - We are going to start you on a daily controller medication (which includes an inhaled steroid and a long acting form of albuterol). - I am guessing that your breathing worsened once you decreased your Medrol dosing from 4mg  to 2mg . - We are going to get some lab work in case we need to start a biologic (injectable drug for asthma). - Spacer sample and demonstration provided. - Daily controller medication(s): Symbicort 80/4.37mcg two puffs twice daily with spacer - Prior to physical activity: albuterol 2 puffs 10-15 minutes before physical activity. - Rescue medications: albuterol 4 puffs every 4-6 hours as needed - Asthma control goals:  * Full participation in all desired activities (may need albuterol before activity) * Albuterol use two time or less a week on average (not counting use with activity) * Cough interfering with sleep two time or less a month * Oral steroids no more than once a year * No hospitalizations  2. Seasonal and perennial allergic rhinitis - Testing today showed: indoor molds, outdoor molds, dust mites and dog - Copy of test results provided.  - Avoidance measures provided. - Continue with: Zyrtec (cetirizine) 10mg  tablet once daily - Start taking: Flonase (fluticasone) one spray per nostril daily (aim for the nose) - You can use an extra dose of the antihistamine, if needed, for breakthrough symptoms.  - Consider nasal saline  rinses 1-2 times daily to remove allergens from the nasal cavities as well as help with mucous clearance (this is especially helpful to do before the nasal sprays are given) - Consider allergy shots as a means of long-term control. - Allergy shots "re-train" and "reset" the immune system to ignore environmental allergens and decrease the resulting immune response to those allergens (sneezing, itchy watery eyes, runny nose, nasal  congestion, etc).    - Allergy shots improve symptoms in 75-85% of patients.  - We can discuss more at the next appointment if the medications are not working for you.  3. Return in about 4 weeks (around 04/20/2019). This can be an in-person, a virtual Webex or a telephone follow up visit.   Subjective:   ARL VANDORN is a 84 y.o. male presenting today for evaluation of  Chief Complaint  Patient presents with  . Chronic Bronchitis    develops shortness of breath when he exerts himself. he got winded walking in today from the car,     LORANZO TIANO has a history of the following: Patient Active Problem List   Diagnosis Date Noted  . Moderate persistent asthma without complication [id number]  . Seasonal and perennial allergic rhinitis 03/23/2019  . Asbestos exposure 03/10/2019  . OSA (obstructive sleep apnea) 03/10/2019  . Swelling of throat 02/24/2019  . Hyperlipidemia, unspecified   . Exposure to other animate mechanical forces, initial encounter   . Localized swelling, mass and lump, right lower limb   . Unspecified chronic bronchitis ([place])     History obtained from: chart review and patient.  [name] was referred by Maryruth Hancock, MD.     Venora Maples is a 84 y.o. male presenting for an evaluation of environmental allergies.  He starts his story back in 2007. He developed a hacking cough in 2007. He went to see Dr. Luan Pulling and a breathing test was normal. He did see an ENT (Dr. Benjamine Mola) who reported that he had a "right nostril that was crooked".  This apparently was never corrected.  He was sent to Dr. Ishmael Holter and had testing that was positive to dust mites and molds. This is not in our system at all, therefore must of been over 10 years ago. He never got allergy shots and never went back to see Dr. Ishmael Holter.  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  On one  particular occasion, he was on a road trip with his wife and they went out [place] and he felt fantastic.  Then when they entered [place] he developed a coughing again.  Sometime around there, he was placed on Medrol 4 mg daily.  It is unclear whether this was supposed to be a daily maintenance medication, but he does report that it helped the coughing and his allergy symptoms.  He thinks that he has taken it for around 10 years.  This was prescribed by Dr. Luan Pulling.  However, Dr. Maryjean Ka retired in December 2020 and he transferred care to Dr. Oren Section.  She wanted to get this Medrol issue addressed, so she referred him to see Dr. Elsworth Soho with pulmonology.  He recommended decreasing to 2 mg for 3 months with plans to possibly stop at that time.  Since going down to 2 mg, he does think that his breathing has worsened.  Dr. Elsworth Soho did give him a  prescription for albuterol, which he has been using every 4-6 hours since that time.  The albuterol does help his symptoms.  Of note, he did have a CBC with differential in September 2019 and noted an elevated absolute eosinophil count of 800.  There was some concern that some of his issues might be heart related with regards to her shortness of breath, so we did go see her cardiologist in the [place].  He had an extensive work-up that reportedly was normal aside from some valvular issues.  He tells me that this was to be expected for patients of his age.  On exam, he does have what sounds like aortic stenosis.  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  He has had several normal bone scans recently.  He does see an ophthalmologist and has a history of glaucoma.  He does not see an endocrinologist.  Otherwise, there is no history of other atopic diseases, including food allergies, drug allergies, stinging insect allergies, eczema, urticaria or contact dermatitis. There is no significant  infectious history. Vaccinations are up to date.    Past Medical History: Patient Active Problem List   Diagnosis Date Noted  . Moderate persistent asthma without complication [id number]  . Seasonal and perennial allergic rhinitis 03/23/2019  . Asbestos exposure 03/10/2019  . OSA (obstructive sleep apnea) 03/10/2019  . Swelling of throat 02/24/2019  . Hyperlipidemia, unspecified   . Exposure to other animate mechanical forces, initial encounter   . Localized swelling, mass and lump, right lower limb   . Unspecified chronic bronchitis ([place])     Medication List:  Allergies as of 03/23/2019      Reactions   Ceftin [cefuroxime Axetil]       Medication List       Accurate as of March 23, 2019 11:59 PM. If you have any questions, ask your nurse or doctor.        STOP taking these medications   levofloxacin 500 MG tablet Commonly known as: LEVAQUIN Stopped by: Valentina Shaggy, MD   triamcinolone cream 0.1 % Commonly known as: KENALOG Stopped by: Valentina Shaggy, MD     TAKE these medications   acetaminophen 500 MG tablet Commonly known as: TYLENOL Take 1,000 mg by mouth daily.   albuterol 108 (90 Base) MCG/ACT inhaler Commonly known as: VENTOLIN HFA Inhale 2 puffs into the lungs every 6 (six) hours as needed for wheezing or shortness of breath.   atorvastatin 20 MG tablet Commonly known as: LIPITOR Take 10 mg by mouth daily.   cetirizine 10 MG tablet Commonly known as: ZYRTEC Take 10 mg by mouth daily.   cholecalciferol 1000 units tablet Commonly known as: VITAMIN D Take 1,000 Units by mouth daily.   methylPREDNISolone 2 MG tablet Commonly known as: Medrol Take 1 tablet (2 mg total) by mouth daily. What changed: Another medication with the same name was removed. Continue taking this medication, and follow the directions you see here. Changed by: Valentina Shaggy, MD   phenylephrine 10 MG Tabs tablet Commonly known as: SUDAFED PE Take 10 mg  by mouth at bedtime.       Birth History: non-contributory  Developmental History: non-contributory  Past Surgical History: Past Surgical History:  Procedure Laterality Date  . CATARACT EXTRACTION W/PHACO Right 03/19/2015   Procedure: CATARACT EXTRACTION PHACO AND INTRAOCULAR LENS PLACEMENT; CDE:  9.70;  Surgeon: Debe Coder  Iona Hansen, MD;  Location: [facility];  Service: Ophthalmology;  Laterality: Right;  . CATARACT EXTRACTION W/PHACO Left 03/16/2017   Procedure: CATARACT EXTRACTION PHACO AND INTRAOCULAR LENS PLACEMENT (IOC);  Surgeon: Tonny Branch, MD;  Location: [facility];  Service: Ophthalmology;  Laterality: Left;  CDE: 26.41  . EYE SURGERY       Family History: Family History  Problem Relation Age of Onset  . Cancer Mother   . COPD Father   . Allergic rhinitis Neg Hx   . Angioedema Neg Hx   . Asthma Neg Hx   . Atopy Neg Hx   . Eczema Neg Hx   . Immunodeficiency Neg Hx   . Urticaria Neg Hx      Social History: Venora Maples lives at home with his wife.  They live in a house that is 53 years old.  There is carpeting throughout the home.  Have gas heating and central cooling.  There is 1 your key dog in the home.  There are no dust mite covers on the bedding.  There is no tobacco exposure.  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  He was in the [employer] in the 1960s and 1970s.   Review of Systems  Constitutional: Negative.  Negative for chills, fever, malaise/fatigue and weight loss.  HENT: Positive for congestion. Negative for ear discharge and ear pain.        Positive for postnasal drip.  Eyes: Negative for pain, discharge and redness.  Respiratory: Positive for shortness of breath. Negative for cough, sputum production and wheezing.   Cardiovascular: Negative.  Negative for chest pain and palpitations.  Gastrointestinal: Negative for abdominal pain, constipation, diarrhea, heartburn, nausea and vomiting.  Skin:  Negative.  Negative for itching and rash.  Neurological: Negative for dizziness and headaches.  Endo/Heme/Allergies: Negative for environmental allergies. Does not bruise/bleed easily.       Objective:   Blood pressure 124/74, pulse 88, temperature 98.2 F (36.8 C), temperature source Temporal, resp. rate 18, height 5\' 6"  (1.676 m), weight 184 lb (83.5 kg), SpO2 97 %. Body mass index is 29.7 kg/m.   Physical Exam:   Physical Exam  Constitutional: He appears well-developed.  Very talkative pleasant male.  HENT:  Head: Normocephalic and atraumatic.  Right Ear: Tympanic membrane, external ear and ear canal normal. No drainage, swelling or tenderness. Tympanic membrane is not injected, not scarred, not erythematous, not retracted and not bulging.  Left Ear: Tympanic membrane, external ear and ear canal normal. No drainage, swelling or tenderness. Tympanic membrane is not injected, not scarred, not erythematous, not retracted and not bulging.  Nose: Mucosal edema and rhinorrhea present. No nasal deformity or septal deviation. No epistaxis. Right sinus exhibits no maxillary sinus tenderness and no frontal sinus tenderness. Left sinus exhibits no maxillary sinus tenderness and no frontal sinus tenderness.  Mouth/Throat: Uvula is midline and oropharynx is clear and moist. Mucous membranes are not pale and not dry.  Hearing aids in place bilaterally.  Turbinates normal-appearing.  Eyes: Pupils are equal, round, and reactive to light. Conjunctivae and EOM are normal. Right eye exhibits no chemosis and no discharge. Left eye exhibits no chemosis and no discharge. Right conjunctiva is not injected. Left conjunctiva is not injected.  Cardiovascular: Normal rate and regular rhythm.  Murmur heard.  Systolic murmur is present with a grade of 3/6. Crescendo decrescendo murmur heard in the aortic region.  Respiratory: Effort normal and  breath sounds normal. No accessory muscle usage. No tachypnea. No  respiratory distress. He has no wheezes. He has no rhonchi. He has no rales. He exhibits no tenderness.  Moving air well in all lung fields.  Initially he was not wheezing, but later on during the exam he did have some wheezing in the right side.  GI: There is no abdominal tenderness. There is no rebound and no guarding.  Lymphadenopathy:       Head (right side): No submandibular, no tonsillar and no occipital adenopathy present.       Head (left side): No submandibular, no tonsillar and no occipital adenopathy present.    He has no cervical adenopathy.  Neurological: He is alert.  Skin: No abrasion, no petechiae and no rash noted. Rash is not papular, not vesicular and not urticarial. No erythema. No pallor.  Very thin appearing skin.  He does have several bruises.  Psychiatric: He has a normal mood and affect.     Diagnostic studies:    Spirometry: results abnormal (FEV1: 1.16/52%, FVC: 1.74/49%, FEV1/FVC: 66%).    Spirometry consistent with mixed obstructive and restrictive disease. Four puffs Xopenex via spacer treatment given in clinic with significant improvement in FEV1 and FVC per ATS criteria. His FVC increased 29% and his FEV1 increased 44%.   Allergy Studies:    Airborne Adult Perc - 03/23/19 1500    Time Antigen Placed  [id number]    Allergen Manufacturer  Lavella Hammock    Location  Back    Number of Test  59    1. Control-Buffer 50% Glycerol  Negative    2. Control-Histamine 1 mg/ml  2+    3. Albumin saline  Negative    4. [place]  Negative    5. [place]  Negative    6. Johnson  Negative    7. [place] Blue  Negative    8. Meadow Fescue  Negative    9. Perennial Rye  Negative    10. Sweet Vernal  Negative    11. Timothy  Negative    12. Cocklebur  Negative    13. Burweed [place]er  Negative    14. Ragweed, short  Negative    15. Ragweed, Giant  Negative    16. Plantain,  English  Negative    17. Lamb's Quarters  Negative    18. Sheep Sorrell  Negative    19. Rough Pigweed   Negative    20. Marsh Elder, Rough  Negative    21. Mugwort, Common  Negative    22. Ash mix  Negative    23. Birch mix  Negative    24. Beech American  Negative    25. Box, Elder  Negative    26. Cedar, red  Negative    27. Cottonwood, [place]  Negative    28. Elm mix  Negative    29. Hickory mix  Negative    30. Maple mix  Negative    31. Oak, [place] mix  Negative    32. Pecan Pollen  Negative    33. Pine mix  Negative    34. Sycamore Eastern  Negative    35. [place], Black Pollen  Negative    36. Alternaria alternata  Negative    37. Cladosporium Herbarum  Negative    38. Aspergillus mix  Negative    39. Penicillium mix  Negative    40. Bipolaris sorokiniana (Helminthosporium)  Negative    41. Drechslera spicifera (Curvularia)  Negative  42. Mucor plumbeus  Negative    43. Fusarium moniliforme  Negative    44. Aureobasidium pullulans (pullulara)  Negative    45. Rhizopus oryzae  Negative    46. Botrytis cinera  Negative    47. Epicoccum nigrum  Negative    48. Phoma betae  Negative    49. Candida Albicans  Negative    50. Trichophyton mentagrophytes  Negative    51. Mite, D Farinae  5,000 AU/ml  Negative    52. Mite, D Pteronyssinus  5,000 AU/ml  Negative    53. Cat Hair 10,000 BAU/ml  Negative    54.  Dog Epithelia  Negative    55. Mixed Feathers  Negative    56. Horse Epithelia  Negative    57. Cockroach, German  Negative    58. Mouse  Negative    59. Tobacco Leaf  Negative     Intradermal - 03/23/19 1500    Time Antigen Placed  [id number]    Allergen Manufacturer  Lavella Hammock    Location  Arm    Number of Test  15    Control  Negative    [place]  Negative    Johnson  Negative    7 Grass  Negative    Ragweed mix  Negative    Weed mix  Negative    Tree mix  Negative    Mold 1  1+    Mold 2  2+    Mold 3  1+    Mold 4  Negative    Cat  Negative    Dog  2+    Cockroach  Negative    Mite mix  1+       Allergy testing results were read and interpreted by myself,  documented by clinical staff.         Salvatore Marvel, MD [facility]

## 2019-03-24 ENCOUNTER — Telehealth: Payer: Self-pay | Admitting: Allergy & Immunology

## 2019-03-24 NOTE — Telephone Encounter (Signed)
Pt called and said that they gave him paper work to go to [facility] [phone].

## 2019-03-24 NOTE — Telephone Encounter (Signed)
Left message to discuss paperwork for [facility]

## 2019-03-24 NOTE — Telephone Encounter (Signed)
Patient notified of getting labs at [facility]

## 2019-03-25 ENCOUNTER — Encounter: Payer: Self-pay | Admitting: Allergy & Immunology

## 2019-03-25 DIAGNOSIS — J4520 Mild intermittent asthma, uncomplicated: Secondary | ICD-10-CM | POA: Diagnosis not present

## 2019-03-28 LAB — CBC WITH DIFFERENTIAL/PLATELET
Basophils Absolute: 0.1 10*3/uL (ref 0.0–0.2)
Basos: 1 %
EOS (ABSOLUTE): 0.8 10*3/uL — ABNORMAL HIGH (ref 0.0–0.4)
Eos: 7 %
Hematocrit: 41.4 % (ref 37.5–51.0)
Hemoglobin: 14.4 g/dL (ref 13.0–17.7)
Immature Grans (Abs): 0 10*3/uL (ref 0.0–0.1)
Immature Granulocytes: 0 %
Lymphocytes Absolute: 1.9 10*3/uL (ref 0.7–3.1)
Lymphs: 17 %
MCH: 32.1 pg (ref 26.6–33.0)
MCHC: 34.8 g/dL (ref 31.5–35.7)
MCV: 92 fL (ref 79–97)
Monocytes Absolute: 1.1 10*3/uL — ABNORMAL HIGH (ref 0.1–0.9)
Monocytes: 10 %
Neutrophils Absolute: 7.3 10*3/uL — ABNORMAL HIGH (ref 1.4–7.0)
Neutrophils: 65 %
Platelets: 226 10*3/uL (ref 150–450)
RBC: 4.49 x10E6/uL (ref 4.14–5.80)
RDW: 12.1 % (ref 11.6–15.4)
WBC: 11.2 10*3/uL — ABNORMAL HIGH (ref 3.4–10.8)

## 2019-03-28 LAB — IGE: IgE (Immunoglobulin E), Serum: 118 IU/mL (ref 6–495)

## 2019-03-28 NOTE — Addendum Note (Signed)
Addended by: Valentina Shaggy on: 03/28/2019 08:36 AM   Modules accepted: Orders

## 2019-04-05 ENCOUNTER — Ambulatory Visit (HOSPITAL_COMMUNITY)
Admission: RE | Admit: 2019-04-05 | Discharge: 2019-04-05 | Disposition: A | Discharge status: Home / Self Care | Payer: Medicare Other | Source: Ambulatory Visit | Attending: Family Medicine | Admitting: Family Medicine

## 2019-04-05 ENCOUNTER — Encounter: Payer: Self-pay | Admitting: Family Medicine

## 2019-04-05 ENCOUNTER — Other Ambulatory Visit: Payer: Self-pay

## 2019-04-05 ENCOUNTER — Ambulatory Visit (INDEPENDENT_AMBULATORY_CARE_PROVIDER_SITE_OTHER): Payer: Medicare Other | Admitting: Family Medicine

## 2019-04-05 VITALS — BP 165/71 | HR 70 | Temp 97.8°F | Ht 66.0 in | Wt 194.4 lb

## 2019-04-05 DIAGNOSIS — R609 Edema, unspecified: Secondary | ICD-10-CM | POA: Insufficient documentation

## 2019-04-05 DIAGNOSIS — I10 Essential (primary) hypertension: Secondary | ICD-10-CM | POA: Diagnosis not present

## 2019-04-05 DIAGNOSIS — R0602 Shortness of breath: Secondary | ICD-10-CM | POA: Diagnosis not present

## 2019-04-05 DIAGNOSIS — R600 Localized edema: Secondary | ICD-10-CM | POA: Diagnosis not present

## 2019-04-05 NOTE — Patient Instructions (Signed)
Recheck blood pressure-concern for elevation Blood work recheck

## 2019-04-05 NOTE — Progress Notes (Signed)
Established Patient Office Visit  Subjective:  Patient ID: Ryan Hanson, male    DOB: Jul 06, 1935  Age: 84 y.o. MRN: [id number]  CC:  Chief Complaint  Patient presents with  . Foot Swelling    leg/swelling    HPI KEIL CORAZZA presents for right ankle and calf swelling starting on Friday then left ankle and calf over the weekend-ankles only previously.  No pain.  Off oral steroids completely x 1 month.  Pt decided to come completely off steroids after being told to wean to 2mg  then off.  No leg pain currently.  On treadmill at gym with no difficulty with workout. Tightness with foot /ankle- worsing at night. Pt with no prior DVT.  Pt does not take blood thinners.   HTN-pt does not take bp at home  Cough -almost gone with symbicort and albuterol as needed  ECG Sept 2020  Past Medical History:  Diagnosis Date  . Allergy   . Angio-edema   . Asbestos exposure   . Cataract   . Chronic cough   . Dyspnea    chronic  . Exposure to other animate mechanical forces, initial encounter   . Heart murmur   . History of kidney stones   . Hyperlipidemia   . Hyperlipidemia, unspecified   . Localized swelling, mass and lump, right lower limb   . Moderate persistent asthma without complication 99991111  . Recurrent upper respiratory infection (URI)   . Unspecified chronic bronchitis ([place])     Past Surgical History:  Procedure Laterality Date  . CATARACT EXTRACTION W/PHACO Right 03/19/2015   Procedure: CATARACT EXTRACTION PHACO AND INTRAOCULAR LENS PLACEMENT; CDE:  9.70;  Surgeon: Williams Che, MD;  Location: [facility];  Service: Ophthalmology;  Laterality: Right;  . CATARACT EXTRACTION W/PHACO Left 03/16/2017   Procedure: CATARACT EXTRACTION PHACO AND INTRAOCULAR LENS PLACEMENT (IOC);  Surgeon: Tonny Branch, MD;  Location: [facility];  Service: Ophthalmology;  Laterality: Left;  CDE: 26.41  . EYE SURGERY      Family History  Problem Relation Age of Onset  . Cancer Mother   . COPD  Father   . Allergic rhinitis Neg Hx   . Angioedema Neg Hx   . Asthma Neg Hx   . Atopy Neg Hx   . Eczema Neg Hx   . Immunodeficiency Neg Hx   . Urticaria Neg Hx     Social History   Socioeconomic History  . Marital status: Widowed    Spouse name: Not on file  . Number of children: Not on file  . Years of education: Not on file  . Highest education level: Not on file  Occupational History  . Not on file  Tobacco Use  . Smoking status: Former Smoker    Packs/day: 2.00    Years: 30.00    Pack years: 60.00    Types: Cigarettes    Quit date: 03/12/1978    Years since quitting: 41.0  . Smokeless tobacco: Never Used  Substance and Sexual Activity  . Alcohol use: No  . Drug use: No  . Sexual activity: Not Currently    Birth control/protection: None  Other Topics Concern  . Not on file  Social History Narrative  . Not on file   Social Determinants of Health   Financial Resource Strain:   . Difficulty of Paying Living Expenses: Not on file  Food Insecurity:   . Worried About Charity fundraiser in the Last Year: Not on file  .  Ran Out of Food in the Last Year: Not on file  Transportation Needs:   . Lack of Transportation (Medical): Not on file  . Lack of Transportation (Non-Medical): Not on file  Physical Activity:   . Days of Exercise per Week: Not on file  . Minutes of Exercise per Session: Not on file  Stress:   . Feeling of Stress : Not on file  Social Connections:   . Frequency of Communication with Friends and Family: Not on file  . Frequency of Social Gatherings with Friends and Family: Not on file  . Attends Religious Services: Not on file  . Active Member of Clubs or Organizations: Not on file  . Attends Archivist Meetings: Not on file  . Marital Status: Not on file  Intimate Partner Violence:   . Fear of Current or Ex-Partner: Not on file  . Emotionally Abused: Not on file  . Physically Abused: Not on file  . Sexually Abused: Not on file     Outpatient Medications Prior to Visit  Medication Sig Dispense Refill  . budesonide-formoterol (SYMBICORT) 160-4.5 MCG/ACT inhaler Inhale 2 puffs into the lungs 2 (two) times daily.    Marland Kitchen acetaminophen (TYLENOL) 500 MG tablet Take 1,000 mg by mouth daily.    Marland Kitchen atorvastatin (LIPITOR) 20 MG tablet Take 10 mg by mouth daily.    . cetirizine (ZYRTEC) 10 MG tablet Take 10 mg by mouth daily.    . cholecalciferol (VITAMIN D) 1000 units tablet Take 1,000 Units by mouth daily.    . methylPREDNISolone (MEDROL) 2 MG tablet Take 1 tablet (2 mg total) by mouth daily. 60 tablet 1  . albuterol (VENTOLIN HFA) 108 (90 Base) MCG/ACT inhaler Inhale 2 puffs into the lungs every 6 (six) hours as needed for wheezing or shortness of breath. (Patient not taking: Reported on 04/05/2019) 8 g 6  . phenylephrine (SUDAFED PE) 10 MG TABS tablet Take 10 mg by mouth at bedtime.     No facility-administered medications prior to visit.    Allergies  Allergen Reactions  . Ceftin [Cefuroxime Axetil]     ROS Review of Systems  Constitutional: Negative for fatigue and fever.  Eyes: Negative.   Respiratory: Positive for cough.   Cardiovascular: Positive for leg swelling. Negative for chest pain and palpitations.       Left calf bigger than right  Genitourinary:       Increase in urination on steroids now decreasing off steroids      Objective:    Physical Exam  Constitutional: He is oriented to person, place, and time. He appears well-developed and well-nourished.  HENT:  Head: Normocephalic.  Cardiovascular: Normal rate, regular rhythm, normal heart sounds and intact distal pulses.  Right 39cm -calf, no pain with palpation, no eryth, no palpable mass  Left 39.5cm-calf, no pain with palpation, no eryth, no palpable mass  Edema bilat ankles and lower legs  Pulmonary/Chest: Effort normal and breath sounds normal.  Neurological: He is oriented to person, place, and time.  Skin: Skin is warm. No erythema.   Psychiatric: He has a normal mood and affect.    BP (!) 165/71 (BP Location: Left Arm, Patient Position: Sitting, Cuff Size: Normal)   Pulse 70   Temp 97.8 F (36.6 C) (Oral)   Ht 5\' 6"  (1.676 m)   Wt 194 lb 6.4 oz (88.2 kg)   SpO2 95%   BMI 31.38 kg/m  Wt Readings from Last 3 Encounters:  04/05/19 194 lb 6.4 oz (  88.2 kg)  03/23/19 184 lb (83.5 kg)  03/10/19 184 lb 6.4 oz (83.6 kg)     Health Maintenance Due  Topic Date Due  . PNA vac Low Risk Adult (2 of 2 - PPSV23) 10/28/2014    Lab Results  Component Value Date   TSH 1.99 11/10/2018   Lab Results  Component Value Date   WBC 11.2 (H) 03/25/2019   HGB 14.4 03/25/2019   HCT 41.4 03/25/2019   MCV 92 03/25/2019   PLT 226 03/25/2019   Lab Results  Component Value Date   NA 140 11/10/2018   K 4.4 11/10/2018   CO2 31 11/10/2018   GLUCOSE 105 (H) 03/16/2017   BUN 15 11/10/2018   CREATININE 1.06 11/10/2018   BILITOT 0.7 11/10/2018   ALKPHOS 96 11/10/2018   AST 14 11/10/2018   ALT 15 11/10/2018   PROT 5.7 (A) 11/10/2018   PROT 5.7 (A) 11/10/2018   ALBUMIN 3.9 11/10/2018   CALCIUM 9.0 11/10/2018   ANIONGAP 8 03/13/2015   Lab Results  Component Value Date   CHOL 157 11/10/2018   Lab Results  Component Value Date   HDL 73 (A) 11/10/2018   Lab Results  Component Value Date   LDLCALC 69 11/10/2018   Lab Results  Component Value Date   TRIG 69 11/10/2018   Lab Results  Component Value Date   CHOLHDL 2.2 11/10/2018     Assessment & Plan:  1. Edema, peripheral New onset LE edema-elevated blood pressure - DG Chest 2 View; Future-no cardiac enlargement-reviewed xray - US Venous Img Lower Bilateral; Future-negative-reviewed venous doppler - EKG 12-Lead SR-reviewed ecg cmp normal except low TP 2. Essential hypertension Diet modification-no added salt-d/w pt  bp check in the early morning 45 minutes spent in discussion about history , review of old records-doppler and ecg, physical exam, assessment,  plan Follow-up: 2 weeks-recheck blood pressure   Amoreena Neubert Hannah Beat, MD

## 2019-04-06 ENCOUNTER — Telehealth: Payer: Self-pay | Admitting: Family Medicine

## 2019-04-06 DIAGNOSIS — R899 Unspecified abnormal finding in specimens from other organs, systems and tissues: Secondary | ICD-10-CM | POA: Diagnosis not present

## 2019-04-06 NOTE — Telephone Encounter (Signed)
D/w pt results-reminded to check bp

## 2019-04-06 NOTE — Telephone Encounter (Signed)
Patient is calling and states he had a missed call wanting to know how his test went yesterday?

## 2019-04-06 NOTE — Telephone Encounter (Signed)
Left Ted a voicemail x-rays were normal

## 2019-04-08 LAB — ANCA TITERS
Atypical pANCA: <1:20 {titer}
C-ANCA: <1:20 {titer}
P-ANCA: <1:20 {titer}

## 2019-04-08 LAB — CMP14+EGFR
ALT: 27 IU/L (ref 0–44)
AST: 25 IU/L (ref 0–40)
Albumin/Globulin Ratio: 2.2 (ref 1.2–2.2)
Albumin: 4 g/dL (ref 3.6–4.6)
Alkaline Phosphatase: 123 IU/L — ABNORMAL HIGH (ref 39–117)
BUN/Creatinine Ratio: 5 — ABNORMAL LOW (ref 10–24)
BUN: 5 mg/dL — ABNORMAL LOW (ref 8–27)
Bilirubin Total: 0.6 mg/dL (ref 0.0–1.2)
CO2: 25 mmol/L (ref 20–29)
Calcium: 9.2 mg/dL (ref 8.6–10.2)
Chloride: 102 mmol/L (ref 96–106)
Creatinine, Ser: 1.04 mg/dL (ref 0.76–1.27)
GFR calc Af Amer: 77 mL/min/{1.73_m2} (ref >59)
GFR calc non Af Amer: 67 mL/min/{1.73_m2} (ref >59)
Globulin, Total: 1.8 g/dL (ref 1.5–4.5)
Glucose: 91 mg/dL (ref 65–99)
Potassium: 4.4 mmol/L (ref 3.5–5.2)
Sodium: 140 mmol/L (ref 134–144)
Total Protein: 5.8 g/dL — ABNORMAL LOW (ref 6.0–8.5)

## 2019-04-08 LAB — B12 AND FOLATE PANEL
Folate: 8.1 ng/mL (ref >3.0)
Vitamin B-12: 242 pg/mL (ref 232–1245)

## 2019-04-08 LAB — TRYPTASE: Tryptase: 4.6 ug/L (ref 2.2–13.2)

## 2019-04-08 LAB — TROPONIN I: Troponin I: <0.01 ng/mL (ref 0.00–0.04)

## 2019-04-20 ENCOUNTER — Encounter: Payer: Self-pay | Admitting: Allergy & Immunology

## 2019-04-20 ENCOUNTER — Ambulatory Visit: Payer: Medicare Other | Admitting: Allergy & Immunology

## 2019-04-20 ENCOUNTER — Other Ambulatory Visit: Payer: Self-pay

## 2019-04-20 VITALS — BP 148/80 | HR 99 | Temp 98.4°F | Resp 16

## 2019-04-20 DIAGNOSIS — F1920 Other psychoactive substance dependence, uncomplicated: Secondary | ICD-10-CM

## 2019-04-20 DIAGNOSIS — J3089 Other allergic rhinitis: Secondary | ICD-10-CM

## 2019-04-20 DIAGNOSIS — J302 Other seasonal allergic rhinitis: Secondary | ICD-10-CM | POA: Diagnosis not present

## 2019-04-20 DIAGNOSIS — J4540 Moderate persistent asthma, uncomplicated: Secondary | ICD-10-CM | POA: Diagnosis not present

## 2019-04-20 NOTE — Progress Notes (Signed)
FOLLOW UP  Date of Service/Encounter:  04/20/19   Assessment:   Moderate persistent asthma without complication  Seasonal and perennial allergic rhinitis (indoor molds, outdoor molds, dust mites and dog)  Steroid dependence - recently stopped Medrol completely (gets regular bone scans and eye exams)  Elevated absolute eosinophil count of 800 (September 2019) - with normal ANCAs, tryptase, CMP, and troponin  Plan/Recommendations:   1. Moderate persistent asthma, uncomplicated - Lung testing looked better today. - We are going to get a morning cortisol level (fasting, first thing in the morning) to make sure that your adrenal glands are working. - Adrenal glands can be suppressed with chronic prednisone use.  - We are going to add on an anti-eosinophil injectable asthma medication. - Information provided on Dupixent, Nucala, and Fasenra.  - Tammy will reach out to you regarding the addition of these medications.  - Daily controller medication(s): Symbicort 160/4.83mcg two puffs twice daily with spacer - Prior to physical activity: albuterol 2 puffs 10-15 minutes before physical activity. - Rescue medications: albuterol 4 puffs every 4-6 hours as needed - Asthma control goals:  * Full participation in all desired activities (may need albuterol before activity) * Albuterol use two time or less a week on average (not counting use with activity) * Cough interfering with sleep two time or less a month * Oral steroids no more than once a year * No hospitalizations  2. Seasonal and perennial allergic rhinitis (indoor molds, outdoor molds, dust mites and dog) - Continue with: Zyrtec (cetirizine) 10mg  tablet once daily and Flonase (fluticasone) one spray per nostril daily  - We can hold off on allergy shots for now.   3. Return in about 6 weeks (around 06/01/2019). This can be an in-person, a virtual Webex or a telephone follow up visit.  Subjective:   Ryan Hanson is a 84  y.o. male presenting today for follow up of  Chief Complaint  Patient presents with  . Asthma    Ryan Hanson has a history of the following: Patient Active Problem List   Diagnosis Date Noted  . Edema, peripheral 04/05/2019  . Essential hypertension 04/05/2019  . Moderate persistent asthma without complication [id number]  . Seasonal and perennial allergic rhinitis 03/23/2019  . Asbestos exposure 03/10/2019  . OSA (obstructive sleep apnea) 03/10/2019  . Swelling of throat 02/24/2019  . Hyperlipidemia, unspecified   . Exposure to other animate mechanical forces, initial encounter   . Localized swelling, mass and lump, right lower limb   . Unspecified chronic bronchitis ([place])     History obtained from: chart review and patient.  Ryan Hanson is a 84 y.o. male presenting for a follow up visit.  He was last seen as a new patient in January 2021.  At that time, he had lung testing which looked terrible but did improve with Xopenex.  He has a history of steroid dependence and has been on Medrol 2 mg daily for 10+ years.  We did start him on Symbicort 80/4.5 mcg 2 puffs twice daily.  I assume that his breathing and worsen once his Medrol was decreased from 4 mg to 2 mg.  We did send in blood work to see if he would qualify for one of our injectable medications.  In addition, we did testing and was positive to indoor and outdoor molds, dust mite, and dog.  We continued Zyrtec and started Flonase.  We did discuss allergen immunotherapy as a means of long-term control.  His labs are  notable for an absolute eosinophil count of 800.  I did obtain ANCA titers which were normal as well as a B12, tryptase, and troponin which were all normal.  A metabolic panel demonstrated normal kidney function with a mildly elevated alkaline phosphatase of 123 (normal 39-117).   Since the last visit, he has done well. He does feel that the Symbicort has helped quite a bit. He is actually now off the Medrol completely. He  went for two weeks and did not see any side effects. Therefore he just decided to remain off of it. The use of his rescue inhaler has decreased significantly. He has not needed any prednisone bursts and has not been to the ED for any asthma issues. ACT is 18, indicating excellent asthma control. He is very happy to be off of the Medrol, but remains open to starting a biologic for better control.  Rhinitis symptoms are controlled with the use of cetirizine and fluticasone. He feels fairly good today allergy wise. He has been compliant with his medications.   Otherwise, there have been no changes to his past medical history, surgical history, family history, or social history.    Review of Systems  Constitutional: Negative.  Negative for chills, fever, malaise/fatigue and weight loss.  HENT: Negative.  Negative for congestion, ear discharge, ear pain and sore throat.   Eyes: Negative for pain, discharge and redness.  Respiratory: Positive for cough. Negative for sputum production, shortness of breath and wheezing.   Cardiovascular: Negative.  Negative for chest pain and palpitations.  Gastrointestinal: Negative for abdominal pain, constipation, diarrhea, heartburn, nausea and vomiting.  Skin: Negative.  Negative for itching and rash.  Neurological: Negative for dizziness and headaches.  Endo/Heme/Allergies: Negative for environmental allergies. Does not bruise/bleed easily.       Objective:   Blood pressure (!) 148/80, pulse 99, temperature 98.4 F (36.9 C), temperature source Temporal, resp. rate 16, SpO2 96 %. There is no height or weight on file to calculate BMI.   Physical Exam:  Physical Exam  Constitutional: He appears well-developed.  HENT:  Head: Normocephalic and atraumatic.  Right Ear: Tympanic membrane, external ear and ear canal normal. No drainage, swelling or tenderness. Tympanic membrane is not injected, not scarred, not erythematous, not retracted and not bulging.   Left Ear: Tympanic membrane, external ear and ear canal normal. No drainage, swelling or tenderness. Tympanic membrane is not injected, not scarred, not erythematous, not retracted and not bulging.  Nose: Mucosal edema and rhinorrhea present. No nasal deformity or septal deviation. No epistaxis. Right sinus exhibits no maxillary sinus tenderness and no frontal sinus tenderness. Left sinus exhibits no maxillary sinus tenderness and no frontal sinus tenderness.  Mouth/Throat: Uvula is midline and oropharynx is clear and moist. Mucous membranes are not pale and not dry.  Eyes: Pupils are equal, round, and reactive to light. Conjunctivae and EOM are normal. Right eye exhibits no chemosis and no discharge. Left eye exhibits no chemosis and no discharge. Right conjunctiva is not injected. Left conjunctiva is not injected.  Cardiovascular: Normal rate, regular rhythm and normal heart sounds.  Respiratory: Effort normal and breath sounds normal. No accessory muscle usage. No tachypnea. No respiratory distress. He has no wheezes. He has no rhonchi. He has no rales. He exhibits no tenderness.  Moving air well in all lung fields. No increased work of breathing noted.   Lymphadenopathy:       Head (right side): No submandibular, no tonsillar and no occipital adenopathy present.  Head (left side): No submandibular, no tonsillar and no occipital adenopathy present.    He has no cervical adenopathy.  Neurological: He is alert.  Skin: No abrasion, no petechiae and no rash noted. Rash is not papular, not vesicular and not urticarial. No erythema. No pallor.  Psychiatric: He has a normal mood and affect. His behavior is normal.     Diagnostic studies:    Spirometry: results abnormal (FEV1: 1.48/63%, FVC: 2.12/63%, FEV1/FVC: 70%).    Spirometry consistent with possible restrictive disease. Overall values are better than those obtained last time.   Allergy Studies: none       Salvatore Marvel, MD   [facility]

## 2019-04-20 NOTE — Patient Instructions (Addendum)
1. Mild intermittent asthma, uncomplicated - Lung testing looked better today. - We are going to get a morning cortisol level (fasting, first thing in the morning) to make sure that your adrenal glands are working. - Adrenal glands can be suppressed with chronic prednisone use.  - We are going to add on an anti-eosinophil injectable asthma medication. - Information provided on Dupixent, Nucala, and Fasenra.  - Tammy will reach out to you regarding the addition of these medications.  - Daily controller medication(s): Symbicort 160/4.72mcg two puffs twice daily with spacer - Prior to physical activity: albuterol 2 puffs 10-15 minutes before physical activity. - Rescue medications: albuterol 4 puffs every 4-6 hours as needed - Asthma control goals:  * Full participation in all desired activities (may need albuterol before activity) * Albuterol use two time or less a week on average (not counting use with activity) * Cough interfering with sleep two time or less a month * Oral steroids no more than once a year * No hospitalizations  2. Seasonal and perennial allergic rhinitis (indoor molds, outdoor molds, dust mites and dog) - Continue with: Zyrtec (cetirizine) 10mg  tablet once daily and Flonase (fluticasone) one spray per nostril daily  - We can hold off on allergy shots for now.   3. Return in about 6 weeks (around 06/01/2019). This can be an in-person, a virtual Webex or a telephone follow up visit.   Please inform us of any Emergency Department visits, hospitalizations, or changes in symptoms. Call us before going to the ED for breathing or allergy symptoms since we might be able to fit you in for a sick visit. Feel free to contact us anytime with any questions, problems, or concerns.  It was a pleasure to see you again today!  Websites that have reliable patient information: 1. American Academy of Asthma, Allergy, and Immunology: www.aaaai.org 2. Food Allergy Research and Education  (FARE): foodallergy.org 3. Mothers of Asthmatics: http://www.asthmacommunitynetwork.org 4. American College of Allergy, Asthma, and Immunology: www.acaai.org   COVID-19 Vaccine Information can be found at: ShippingScam.co.uk For questions related to vaccine distribution or appointments, please email vaccine@[email or web address] or call [phone].     "Like" [place] on Facebook and Instagram for our latest updates!        Make sure you are registered to vote! If you have moved or changed any of your contact information, you will need to get this updated before voting!  In some cases, you MAY be able to register to vote online: [email or web address]

## 2019-04-21 ENCOUNTER — Encounter: Payer: Self-pay | Admitting: Allergy & Immunology

## 2019-04-27 ENCOUNTER — Ambulatory Visit (INDEPENDENT_AMBULATORY_CARE_PROVIDER_SITE_OTHER): Payer: Medicare Other | Admitting: Family Medicine

## 2019-04-27 ENCOUNTER — Encounter: Payer: Self-pay | Admitting: Family Medicine

## 2019-04-27 ENCOUNTER — Other Ambulatory Visit: Payer: Self-pay

## 2019-04-27 VITALS — BP 128/70 | HR 80 | Temp 97.8°F | Ht 68.0 in | Wt 193.0 lb

## 2019-04-27 DIAGNOSIS — E785 Hyperlipidemia, unspecified: Secondary | ICD-10-CM | POA: Diagnosis not present

## 2019-04-27 DIAGNOSIS — J4540 Moderate persistent asthma, uncomplicated: Secondary | ICD-10-CM | POA: Diagnosis not present

## 2019-04-27 DIAGNOSIS — R609 Edema, unspecified: Secondary | ICD-10-CM | POA: Diagnosis not present

## 2019-04-27 LAB — CORTISOL: Cortisol: 7 ug/dL

## 2019-04-27 MED ORDER — FUROSEMIDE 40 MG PO TABS: 40.0000 mg | ORAL_TABLET | Freq: Every day | ORAL | 0 refills | Status: DC

## 2019-04-27 NOTE — Patient Instructions (Signed)
Start lasix every morning Follow up appointment next week Elevated legs at night and with sitting Get your lab test at least 2 days prior

## 2019-04-27 NOTE — Progress Notes (Addendum)
Established Patient Office Visit  Subjective:  Patient ID: Ryan Hanson, male    DOB: 03-23-1935  Age: 84 y.o. MRN: [id number]  CC:  Chief Complaint  Patient presents with  . Edema    f/u getting worse    HPI Ryan Hanson presents for LE edema-worse over the last few years, SOB with exertion over the last 4 years, pt states work in the yard 7min-needs to rest.  Pt states right ankle swelling noted 4 years ago-now bilat.  NO change in diet. Followed at [facility] with echo in 19 to 20 with minimal change.  Pt took lasix for a few months with no further concerns. Steroids orally with swelling noted following.  Pt goes to the gym daily and has not noted any recent changes in exertional energy  Asthma-mild-symbicort inhaler -no albuterol used recently.  Zyrtec for allergies-hoarseness  Past Medical History:  Diagnosis Date  . Allergy   . Angio-edema   . Asbestos exposure   . Cataract   . Chronic cough   . Dyspnea    chronic  . Exposure to other animate mechanical forces, initial encounter   . Heart murmur   . History of kidney stones   . Hyperlipidemia   . Hyperlipidemia, unspecified   . Localized swelling, mass and lump, right lower limb   . Moderate persistent asthma without complication 99991111  . Recurrent upper respiratory infection (URI)   . Unspecified chronic bronchitis ([place])     Past Surgical History:  Procedure Laterality Date  . CATARACT EXTRACTION W/PHACO Right 03/19/2015   Procedure: CATARACT EXTRACTION PHACO AND INTRAOCULAR LENS PLACEMENT; CDE:  9.70;  Surgeon: Williams Che, MD;  Location: [facility];  Service: Ophthalmology;  Laterality: Right;  . CATARACT EXTRACTION W/PHACO Left 03/16/2017   Procedure: CATARACT EXTRACTION PHACO AND INTRAOCULAR LENS PLACEMENT (IOC);  Surgeon: Tonny Branch, MD;  Location: [facility];  Service: Ophthalmology;  Laterality: Left;  CDE: 26.41  . EYE SURGERY      Family History  Problem Relation Age of Onset  . Cancer Mother   . COPD  Father   . Allergic rhinitis Neg Hx   . Angioedema Neg Hx   . Asthma Neg Hx   . Atopy Neg Hx   . Eczema Neg Hx   . Immunodeficiency Neg Hx   . Urticaria Neg Hx     Social History   Socioeconomic History  . Marital status: Widowed    Spouse name: Not on file  . Number of children: Not on file  . Years of education: Not on file  . Highest education level: Not on file  Occupational History  . Not on file  Tobacco Use  . Smoking status: Former Smoker    Packs/day: 2.00    Years: 30.00    Pack years: 60.00    Types: Cigarettes    Quit date: 03/12/1978    Years since quitting: 41.1  . Smokeless tobacco: Never Used  Substance and Sexual Activity  . Alcohol use: No  . Drug use: No  . Sexual activity: Not Currently    Birth control/protection: None  Other Topics Concern  . Not on file  Social History Narrative  . Not on file   Social Determinants of Health   Financial Resource Strain:   . Difficulty of Paying Living Expenses: Not on file  Food Insecurity:   . Worried About Charity fundraiser in the Last Year: Not on file  . Ran Out of Food  in the Last Year: Not on file  Transportation Needs:   . Lack of Transportation (Medical): Not on file  . Lack of Transportation (Non-Medical): Not on file  Physical Activity:   . Days of Exercise per Week: Not on file  . Minutes of Exercise per Session: Not on file  Stress:   . Feeling of Stress : Not on file  Social Connections:   . Frequency of Communication with Friends and Family: Not on file  . Frequency of Social Gatherings with Friends and Family: Not on file  . Attends Religious Services: Not on file  . Active Member of Clubs or Organizations: Not on file  . Attends Archivist Meetings: Not on file  . Marital Status: Not on file  Intimate Partner Violence:   . Fear of Current or Ex-Partner: Not on file  . Emotionally Abused: Not on file  . Physically Abused: Not on file  . Sexually Abused: Not on file     Outpatient Medications Prior to Visit  Medication Sig Dispense Refill  . acetaminophen (TYLENOL) 500 MG tablet Take 1,000 mg by mouth daily.    Marland Kitchen atorvastatin (LIPITOR) 20 MG tablet Take 10 mg by mouth daily.    . budesonide-formoterol (SYMBICORT) 160-4.5 MCG/ACT inhaler Inhale 2 puffs into the lungs 2 (two) times daily.    . cetirizine (ZYRTEC) 10 MG tablet Take 10 mg by mouth daily.    . cholecalciferol (VITAMIN D) 1000 units tablet Take 1,000 Units by mouth daily.    . methylPREDNISolone (MEDROL) 2 MG tablet Take 1 tablet (2 mg total) by mouth daily. (Patient not taking: Reported on 04/27/2019) 60 tablet 1   No facility-administered medications prior to visit.    Allergies  Allergen Reactions  . Ceftin [Cefuroxime Axetil]     ROS Review of Systems  Constitutional: Negative.   Eyes:       Glasses  Respiratory:       Asthma-mild  Cardiovascular: Positive for leg swelling.       Echo 19, 20  Gastrointestinal: Negative.   Musculoskeletal: Negative.   Hematological: Negative.   Psychiatric/Behavioral: Negative.       Objective:    Physical Exam  Constitutional: He is oriented to person, place, and time. He appears well-developed and well-nourished.  Cardiovascular: Regular rhythm, normal heart sounds and intact distal pulses.  Pulmonary/Chest: Effort normal and breath sounds normal.  Musculoskeletal:        General: Edema present.     Comments: 2 + pitting edema ankle and lower legs bilat  Neurological: He is oriented to person, place, and time.  Psychiatric: He has a normal mood and affect. His behavior is normal.  Vitals reviewed.   BP 128/70 (BP Location: Left Arm, Patient Position: Sitting)   Pulse 80   Temp 97.8 F (36.6 C) (Temporal)   Ht 5\' 8"  (1.727 m)   Wt 193 lb (87.5 kg)   SpO2 95%   BMI 29.35 kg/m  Wt Readings from Last 3 Encounters:  04/27/19 193 lb (87.5 kg)  04/05/19 194 lb 6.4 oz (88.2 kg)  03/23/19 184 lb (83.5 kg)    Health  Maintenance Due  Topic Date Due  . PNA vac Low Risk Adult (2 of 2 - PPSV23) 10/28/2014   Lab Results  Component Value Date   TSH 1.99 11/10/2018   Lab Results  Component Value Date   WBC 11.2 (H) 03/25/2019   HGB 14.4 03/25/2019   HCT 41.4 03/25/2019   MCV  92 03/25/2019   PLT 226 03/25/2019   Lab Results  Component Value Date   NA 140 04/06/2019   K 4.4 04/06/2019   CO2 25 04/06/2019   GLUCOSE 91 04/06/2019   BUN 5 (L) 04/06/2019   CREATININE 1.04 04/06/2019   BILITOT 0.6 04/06/2019   ALKPHOS 123 (H) 04/06/2019   AST 25 04/06/2019   ALT 27 04/06/2019   PROT 5.8 (L) 04/06/2019   ALBUMIN 4.0 04/06/2019   CALCIUM 9.2 04/06/2019   ANIONGAP 8 03/13/2015   Lab Results  Component Value Date   CHOL 157 11/10/2018   Lab Results  Component Value Date   HDL 73 (A) 11/10/2018   Lab Results  Component Value Date   LDLCALC 69 11/10/2018   Lab Results  Component Value Date   TRIG 69 11/10/2018   Lab Results  Component Value Date   CHOLHDL 2.2 11/10/2018      Assessment & Plan:   1 Hyperlipidemia, unspecified hyperlipidemia type lipitor-lipid panel ormal 2 Edema, peripheral Lasix 40mg -take daily -follow up next week, bmp prior Echo 11/2018 at [facility] with minimal change noted per pt-no report in chart, doppler normal, cxr normal 3Moderate persistent asthma without complication Allergy following  Satomi Buda Hannah Beat, MD

## 2019-05-02 ENCOUNTER — Other Ambulatory Visit: Payer: Self-pay | Admitting: Family Medicine

## 2019-05-02 DIAGNOSIS — J42 Unspecified chronic bronchitis: Secondary | ICD-10-CM | POA: Diagnosis not present

## 2019-05-02 DIAGNOSIS — J392 Other diseases of pharynx: Secondary | ICD-10-CM | POA: Diagnosis not present

## 2019-05-02 DIAGNOSIS — E785 Hyperlipidemia, unspecified: Secondary | ICD-10-CM | POA: Diagnosis not present

## 2019-05-02 DIAGNOSIS — R7309 Other abnormal glucose: Secondary | ICD-10-CM

## 2019-05-03 ENCOUNTER — Encounter: Payer: Self-pay | Admitting: Family Medicine

## 2019-05-03 ENCOUNTER — Other Ambulatory Visit: Payer: Self-pay

## 2019-05-03 ENCOUNTER — Ambulatory Visit (INDEPENDENT_AMBULATORY_CARE_PROVIDER_SITE_OTHER): Payer: Medicare Other | Admitting: Family Medicine

## 2019-05-03 VITALS — BP 143/77 | HR 59 | Temp 97.4°F | Ht 68.0 in | Wt 192.0 lb

## 2019-05-03 DIAGNOSIS — I10 Essential (primary) hypertension: Secondary | ICD-10-CM

## 2019-05-03 DIAGNOSIS — R609 Edema, unspecified: Secondary | ICD-10-CM

## 2019-05-03 NOTE — Progress Notes (Signed)
Established Patient Office Visit  Subjective:  Patient ID: Ryan Hanson, male    DOB: [birth year]  Age: 84 y.o. MRN: [id number]  CC:  Chief Complaint  Patient presents with  . Follow-up    Patient stated he was here to f/u for feet and ankle swelling. Patient stated he only been taking the med for 4 days now and have not seen any changes    HPI Ryan Hanson presents for LE edema for 4 days-taking Lasix 40mg -no concerns. No SOB. Swelling in feet and lower legs  Past Medical History:  Diagnosis Date  . Allergy   . Angio-edema   . Asbestos exposure   . Cataract   . Chronic cough   . Dyspnea    chronic  . Exposure to other animate mechanical forces, initial encounter   . Heart murmur   . History of kidney stones   . Hyperlipidemia   . Hyperlipidemia, unspecified   . Localized swelling, mass and lump, right lower limb   . Moderate persistent asthma without complication 99991111  . Recurrent upper respiratory infection (URI)   . Unspecified chronic bronchitis ([place])     Past Surgical History:  Procedure Laterality Date  . CATARACT EXTRACTION W/PHACO Right 03/19/2015   Procedure: CATARACT EXTRACTION PHACO AND INTRAOCULAR LENS PLACEMENT; CDE:  9.70;  Surgeon: Williams Che, MD;  Location: [facility];  Service: Ophthalmology;  Laterality: Right;  . CATARACT EXTRACTION W/PHACO Left 03/16/2017   Procedure: CATARACT EXTRACTION PHACO AND INTRAOCULAR LENS PLACEMENT (IOC);  Surgeon: Tonny Branch, MD;  Location: [facility];  Service: Ophthalmology;  Laterality: Left;  CDE: 26.41  . EYE SURGERY      Family History  Problem Relation Age of Onset  . Cancer Mother   . COPD Father   . Allergic rhinitis Neg Hx   . Angioedema Neg Hx   . Asthma Neg Hx   . Atopy Neg Hx   . Eczema Neg Hx   . Immunodeficiency Neg Hx   . Urticaria Neg Hx     Social History   Socioeconomic History  . Marital status: Widowed    Spouse name: Not on file  . Number of children: Not on file  . Years of  education: Not on file  . Highest education level: Not on file  Occupational History  . Not on file  Tobacco Use  . Smoking status: Former Smoker    Packs/day: 2.00    Years: 30.00    Pack years: 60.00    Types: Cigarettes    Quit date: 03/12/1978    Years since quitting: 41.1  . Smokeless tobacco: Never Used  Substance and Sexual Activity  . Alcohol use: No  . Drug use: No  . Sexual activity: Not Currently    Birth control/protection: None  Other Topics Concern  . Not on file  Social History Narrative  . Not on file   Social Determinants of Health   Financial Resource Strain:   . Difficulty of Paying Living Expenses: Not on file  Food Insecurity:   . Worried About Charity fundraiser in the Last Year: Not on file  . Ran Out of Food in the Last Year: Not on file  Transportation Needs:   . Lack of Transportation (Medical): Not on file  . Lack of Transportation (Non-Medical): Not on file  Physical Activity:   . Days of Exercise per Week: Not on file  . Minutes of Exercise per Session: Not on file  Stress:   .  Feeling of Stress : Not on file  Social Connections:   . Frequency of Communication with Friends and Family: Not on file  . Frequency of Social Gatherings with Friends and Family: Not on file  . Attends Religious Services: Not on file  . Active Member of Clubs or Organizations: Not on file  . Attends Archivist Meetings: Not on file  . Marital Status: Not on file  Intimate Partner Violence:   . Fear of Current or Ex-Partner: Not on file  . Emotionally Abused: Not on file  . Physically Abused: Not on file  . Sexually Abused: Not on file    Outpatient Medications Prior to Visit  Medication Sig Dispense Refill  . acetaminophen (TYLENOL) 500 MG tablet Take 1,000 mg by mouth daily.    Marland Kitchen atorvastatin (LIPITOR) 20 MG tablet Take 10 mg by mouth daily.    . budesonide-formoterol (SYMBICORT) 160-4.5 MCG/ACT inhaler Inhale 2 puffs into the lungs 2 (two) times  daily.    . cetirizine (ZYRTEC) 10 MG tablet Take 10 mg by mouth daily.    . cholecalciferol (VITAMIN D) 1000 units tablet Take 1,000 Units by mouth daily.    . furosemide (LASIX) 40 MG tablet Take 1 tablet (40 mg total) by mouth daily. 30 tablet 0   No facility-administered medications prior to visit.    Allergies  Allergen Reactions  . Ceftin [Cefuroxime Axetil]     ROS Review of Systems  Constitutional: Negative.   HENT: Negative.   Respiratory: Negative.   Cardiovascular: Negative.   Endocrine: Negative.   Genitourinary: Negative.   Musculoskeletal: Negative.   Allergic/Immunologic: Negative.   Hematological: Negative.   Psychiatric/Behavioral: Negative.       Objective:    Physical Exam  BP (!) 143/77 (BP Location: Left Arm, Patient Position: Sitting, Cuff Size: Normal)   Pulse (!) 59   Temp (!) 97.4 F (36.3 C) (Temporal)   Ht 5\' 8"  (1.727 m)   Wt 192 lb (87.1 kg)   SpO2 97%   BMI 29.19 kg/m  Wt Readings from Last 3 Encounters:  05/03/19 192 lb (87.1 kg)  04/27/19 193 lb (87.5 kg)  04/05/19 194 lb 6.4 oz (88.2 kg)     Health Maintenance Due  Topic Date Due  . PNA vac Low Risk Adult (2 of 2 - PPSV23) 10/28/2014   Lab Results  Component Value Date   TSH 1.99 11/10/2018   Lab Results  Component Value Date   WBC 7.4 05/02/2019   HGB 14.6 05/02/2019   HCT 43.3 05/02/2019   MCV 92.9 05/02/2019   PLT 237 05/02/2019   Lab Results  Component Value Date   NA 138 05/02/2019   K 4.3 05/02/2019   CO2 31 05/02/2019   GLUCOSE 115 (H) 05/02/2019   BUN 13 05/02/2019   CREATININE 1.18 (H) 05/02/2019   BILITOT 0.7 05/02/2019   ALKPHOS 123 (H) 04/06/2019   AST 18 05/02/2019   ALT 19 05/02/2019   PROT 5.9 (L) 05/02/2019   ALBUMIN 4.0 04/06/2019   CALCIUM 9.4 05/02/2019   ANIONGAP 8 03/13/2015   Lab Results  Component Value Date   CHOL 174 05/02/2019   Lab Results  Component Value Date   HDL 51 05/02/2019   Lab Results  Component Value Date    LDLCALC 99 05/02/2019   Lab Results  Component Value Date   TRIG 142 05/02/2019   Lab Results  Component Value Date   CHOLHDL 3.4 05/02/2019  Assessment & Plan:   1. Edema, peripheral Needs CI - Basic metabolic panel  2. Essential hypertension - Basic metabolic panel-now taking lasix for LE dema No CP, no SOB Follow-up:    Verlon Pischke Hannah Beat, MD

## 2019-05-03 NOTE — Patient Instructions (Addendum)
  Take Lasix 40mg  daily for a total of 2 weeks Recheck BMP 2 days prior to recheck of labwork   If you have lab work done today you will be contacted with your lab results within the next 2 weeks.  If you have not heard from [place] then please contact us. The fastest way to get your results is to register for My Chart.   IF you received an x-ray today, you will receive an invoice from [facility]. Please contact [facility] at [phone] with questions or concerns regarding your invoice.   IF you received labwork today, you will receive an invoice from [place]. Please contact [facility] at [phone] with questions or concerns regarding your invoice.   Our billing staff will not be able to assist you with questions regarding bills from these companies.  You will be contacted with the lab results as soon as they are available. The fastest way to get your results is to activate your My Chart account. Instructions are located on the last page of this paperwork. If you have not heard from [place] regarding the results in 2 weeks, please contact this office.

## 2019-05-04 LAB — LIPID PANEL
Cholesterol: 174 mg/dL (ref <200)
HDL: 51 mg/dL (ref >=40)
LDL Cholesterol (Calc): 99 mg/dL (calc)
Non-HDL Cholesterol (Calc): 123 mg/dL (calc) (ref <130)
Total CHOL/HDL Ratio: 3.4 (calc) (ref <5.0)
Triglycerides: 142 mg/dL (ref <150)

## 2019-05-04 LAB — CBC WITH DIFFERENTIAL/PLATELET
Absolute Monocytes: 932 cells/uL (ref 200–950)
Basophils Absolute: 59 cells/uL (ref 0–200)
Basophils Relative: 0.8 %
Eosinophils Absolute: 770 cells/uL — ABNORMAL HIGH (ref 15–500)
Eosinophils Relative: 10.4 %
HCT: 43.3 % (ref 38.5–50.0)
Hemoglobin: 14.6 g/dL (ref 13.2–17.1)
Lymphs Abs: 2190 cells/uL (ref 850–3900)
MCH: 31.3 pg (ref 27.0–33.0)
MCHC: 33.7 g/dL (ref 32.0–36.0)
MCV: 92.9 fL (ref 80.0–100.0)
MPV: 11.8 fL (ref 7.5–12.5)
Monocytes Relative: 12.6 %
Neutro Abs: 3448 cells/uL (ref 1500–7800)
Neutrophils Relative %: 46.6 %
Platelets: 237 10*3/uL (ref 140–400)
RBC: 4.66 10*6/uL (ref 4.20–5.80)
RDW: 12.2 % (ref 11.0–15.0)
Total Lymphocyte: 29.6 %
WBC: 7.4 10*3/uL (ref 3.8–10.8)

## 2019-05-04 LAB — COMPLETE METABOLIC PANEL WITH GFR
AG Ratio: 2.1 (calc) (ref 1.0–2.5)
ALT: 19 U/L (ref 9–46)
AST: 18 U/L (ref 10–35)
Albumin: 4 g/dL (ref 3.6–5.1)
Alkaline phosphatase (APISO): 96 U/L (ref 35–144)
BUN/Creatinine Ratio: 11 (calc) (ref 6–22)
BUN: 13 mg/dL (ref 7–25)
CO2: 31 mmol/L (ref 20–32)
Calcium: 9.4 mg/dL (ref 8.6–10.3)
Chloride: 101 mmol/L (ref 98–110)
Creat: 1.18 mg/dL — ABNORMAL HIGH (ref 0.70–1.11)
GFR, Est African American: 67 mL/min/{1.73_m2} (ref >=60)
GFR, Est Non African American: 58 mL/min/{1.73_m2} — ABNORMAL LOW (ref >=60)
Globulin: 1.9 g/dL (calc) (ref 1.9–3.7)
Glucose, Bld: 115 mg/dL — ABNORMAL HIGH (ref 65–99)
Potassium: 4.3 mmol/L (ref 3.5–5.3)
Sodium: 138 mmol/L (ref 135–146)
Total Bilirubin: 0.7 mg/dL (ref 0.2–1.2)
Total Protein: 5.9 g/dL — ABNORMAL LOW (ref 6.1–8.1)

## 2019-05-04 LAB — TEST AUTHORIZATION

## 2019-05-04 LAB — HEMOGLOBIN A1C W/OUT EAG: Hgb A1c MFr Bld: 5.5 % of total Hgb (ref <5.7)

## 2019-05-16 DIAGNOSIS — I10 Essential (primary) hypertension: Secondary | ICD-10-CM | POA: Diagnosis not present

## 2019-05-16 DIAGNOSIS — R609 Edema, unspecified: Secondary | ICD-10-CM | POA: Diagnosis not present

## 2019-05-17 LAB — BASIC METABOLIC PANEL
BUN/Creatinine Ratio: 13 (calc) (ref 6–22)
BUN: 17 mg/dL (ref 7–25)
CO2: 31 mmol/L (ref 20–32)
Calcium: 9.4 mg/dL (ref 8.6–10.3)
Chloride: 101 mmol/L (ref 98–110)
Creat: 1.26 mg/dL — ABNORMAL HIGH (ref 0.70–1.11)
Glucose, Bld: 116 mg/dL — ABNORMAL HIGH (ref 65–99)
Potassium: 4 mmol/L (ref 3.5–5.3)
Sodium: 139 mmol/L (ref 135–146)

## 2019-05-18 ENCOUNTER — Other Ambulatory Visit: Payer: Self-pay

## 2019-05-18 ENCOUNTER — Ambulatory Visit (INDEPENDENT_AMBULATORY_CARE_PROVIDER_SITE_OTHER): Payer: Medicare Other | Admitting: Family Medicine

## 2019-05-18 ENCOUNTER — Other Ambulatory Visit: Payer: Self-pay | Admitting: Emergency Medicine

## 2019-05-18 ENCOUNTER — Other Ambulatory Visit: Payer: Self-pay | Admitting: Family Medicine

## 2019-05-18 ENCOUNTER — Encounter: Payer: Self-pay | Admitting: Family Medicine

## 2019-05-18 VITALS — BP 128/71 | HR 71 | Temp 97.6°F | Ht 67.0 in | Wt 187.8 lb

## 2019-05-18 DIAGNOSIS — R609 Edema, unspecified: Secondary | ICD-10-CM

## 2019-05-18 DIAGNOSIS — I10 Essential (primary) hypertension: Secondary | ICD-10-CM

## 2019-05-18 DIAGNOSIS — J4540 Moderate persistent asthma, uncomplicated: Secondary | ICD-10-CM

## 2019-05-18 DIAGNOSIS — E785 Hyperlipidemia, unspecified: Secondary | ICD-10-CM

## 2019-05-18 MED ORDER — ATORVASTATIN CALCIUM 20 MG PO TABS: 10.0000 mg | ORAL_TABLET | Freq: Every day | ORAL | 0 refills | Status: DC

## 2019-05-18 MED ORDER — ATORVASTATIN CALCIUM 20 MG PO TABS: 10.0000 mg | ORAL_TABLET | Freq: Every day | ORAL | 1 refills | Status: DC

## 2019-05-18 NOTE — Patient Instructions (Addendum)
Continue taking Lasix daily for 1 month Recheck labwork-bmp

## 2019-05-18 NOTE — Progress Notes (Signed)
Established Patient Office Visit  Subjective:  Patient ID: Ryan Hanson, male    DOB: May 11, 1935  Age: 84 y.o. MRN: [id number]  CC:  Chief Complaint  Patient presents with  . Follow-up    2 week f/u fpr edema in the lower extremities    HPI Ryan Hanson presents for LE edema-pt states Lasix has improved LE edema. In the morning LE normal with slight increase during the day. Increase urination with lasix but improved sleep overnight.    Asthma-symbicort helping to control symptoms-pt no longer using oral steroids-likely cause of LE edema Pt uses albuterol prn for SOB-prn 4-6 prn. Pt states walking worsens breathing  Past Medical History:  Diagnosis Date  . Allergy   . Angio-edema   . Asbestos exposure   . Cataract   . Chronic cough   . Dyspnea    chronic  . Exposure to other animate mechanical forces, initial encounter   . Heart murmur   . History of kidney stones   . Hyperlipidemia   . Hyperlipidemia, unspecified   . Localized swelling, mass and lump, right lower limb   . Moderate persistent asthma without complication 99991111  . Recurrent upper respiratory infection (URI)   . Unspecified chronic bronchitis ([place])     Past Surgical History:  Procedure Laterality Date  . CATARACT EXTRACTION W/PHACO Right 03/19/2015   Procedure: CATARACT EXTRACTION PHACO AND INTRAOCULAR LENS PLACEMENT; CDE:  9.70;  Surgeon: Williams Che, MD;  Location: [facility];  Service: Ophthalmology;  Laterality: Right;  . CATARACT EXTRACTION W/PHACO Left 03/16/2017   Procedure: CATARACT EXTRACTION PHACO AND INTRAOCULAR LENS PLACEMENT (IOC);  Surgeon: Tonny Branch, MD;  Location: [facility];  Service: Ophthalmology;  Laterality: Left;  CDE: 26.41  . EYE SURGERY      Family History  Problem Relation Age of Onset  . Cancer Mother   . COPD Father   . Allergic rhinitis Neg Hx   . Angioedema Neg Hx   . Asthma Neg Hx   . Atopy Neg Hx   . Eczema Neg Hx   . Immunodeficiency Neg Hx   . Urticaria  Neg Hx     Social History   Socioeconomic History  . Marital status: Widowed    Spouse name: Not on file  . Number of children: Not on file  . Years of education: Not on file  . Highest education level: Not on file  Occupational History  . Not on file  Tobacco Use  . Smoking status: Former Smoker    Packs/day: 2.00    Years: 30.00    Pack years: 60.00    Types: Cigarettes    Quit date: 03/12/1978    Years since quitting: 41.2  . Smokeless tobacco: Never Used  Substance and Sexual Activity  . Alcohol use: No  . Drug use: No  . Sexual activity: Not Currently    Birth control/protection: None  Other Topics Concern  . Not on file  Social History Narrative  . Not on file   Social Determinants of Health   Financial Resource Strain:   . Difficulty of Paying Living Expenses:   Food Insecurity:   . Worried About Charity fundraiser in the Last Year:   . Arboriculturist in the Last Year:   Transportation Needs:   . Film/video editor (Medical):   Marland Kitchen Lack of Transportation (Non-Medical):   Physical Activity:   . Days of Exercise per Week:   . Minutes of  Exercise per Session:   Stress:   . Feeling of Stress :   Social Connections:   . Frequency of Communication with Friends and Family:   . Frequency of Social Gatherings with Friends and Family:   . Attends Religious Services:   . Active Member of Clubs or Organizations:   . Attends Archivist Meetings:   Marland Kitchen Marital Status:   Intimate Partner Violence:   . Fear of Current or Ex-Partner:   . Emotionally Abused:   Marland Kitchen Physically Abused:   . Sexually Abused:     Outpatient Medications Prior to Visit  Medication Sig Dispense Refill  . acetaminophen (TYLENOL) 500 MG tablet Take 1,000 mg by mouth daily.    Marland Kitchen atorvastatin (LIPITOR) 20 MG tablet Take 10 mg by mouth daily.    . budesonide-formoterol (SYMBICORT) 160-4.5 MCG/ACT inhaler Inhale 2 puffs into the lungs 2 (two) times daily.    . cetirizine (ZYRTEC) 10  MG tablet Take 10 mg by mouth daily.    . cholecalciferol (VITAMIN D) 1000 units tablet Take 1,000 Units by mouth daily.    . furosemide (LASIX) 40 MG tablet Take 1 tablet (40 mg total) by mouth daily. 30 tablet 0   No facility-administered medications prior to visit.    Allergies  Allergen Reactions  . Ceftin [Cefuroxime Axetil]     ROS Review of Systems  Respiratory:       Asthma-SOB improved with symbicort  Cardiovascular: Negative.   Endocrine:       DM      Objective:    Physical Exam  Constitutional: He is oriented to person, place, and time. He appears well-developed and well-nourished.  Cardiovascular: Regular rhythm.  Pulmonary/Chest: Effort normal and breath sounds normal.  Musculoskeletal:        General: Edema present. No tenderness.     Right ankle: Swelling present.     Left ankle: Swelling present.     Comments: 1+ edema bilat lower legs  Neurological: He is oriented to person, place, and time.  Skin: No erythema.  Psychiatric: He has a normal mood and affect.    BP 128/71 (BP Location: Right Arm, Patient Position: Sitting, Cuff Size: Normal)   Pulse 71   Temp 97.6 F (36.4 C) (Temporal)   Ht 5\' 7"  (1.702 m)   Wt 187 lb 12.8 oz (85.2 kg)   SpO2 97%   BMI 29.41 kg/m  Wt Readings from Last 3 Encounters:  05/18/19 187 lb 12.8 oz (85.2 kg)  05/03/19 192 lb (87.1 kg)  04/27/19 193 lb (87.5 kg)     Health Maintenance Due  Topic Date Due  . PNA vac Low Risk Adult (2 of 2 - PPSV23) 10/28/2014    Lab Results  Component Value Date   TSH 1.99 11/10/2018   Lab Results  Component Value Date   WBC 7.4 05/02/2019   HGB 14.6 05/02/2019   HCT 43.3 05/02/2019   MCV 92.9 05/02/2019   PLT 237 05/02/2019   Lab Results  Component Value Date   NA 139 05/16/2019   K 4.0 05/16/2019   CO2 31 05/16/2019   GLUCOSE 116 (H) 05/16/2019   BUN 17 05/16/2019   CREATININE 1.26 (H) 05/16/2019   BILITOT 0.7 05/02/2019   ALKPHOS 123 (H) 04/06/2019   AST 18  05/02/2019   ALT 19 05/02/2019   PROT 5.9 (L) 05/02/2019   ALBUMIN 4.0 04/06/2019   CALCIUM 9.4 05/16/2019   ANIONGAP 8 03/13/2015   Lab Results  Component Value Date   CHOL 174 05/02/2019   Lab Results  Component Value Date   HDL 51 05/02/2019   Lab Results  Component Value Date   LDLCALC 99 05/02/2019   Lab Results  Component Value Date   TRIG 142 05/02/2019   Lab Results  Component Value Date   CHOLHDL 3.4 05/02/2019   Lab Results  Component Value Date   HGBA1C 5.5 05/02/2019      Assessment & Plan:  1. Essential hypertension - Basic metabolic panel Lasix -continue for 1 additional month-K normal renal function stable 2. Moderate persistent asthma without complication Stable with symbicort  3. Edema, peripheral Improved on Lasix Follow-up:  1 month Bmp prior LISA Hannah Beat, MD

## 2019-05-18 NOTE — Telephone Encounter (Signed)
Rx has been sent into the pharmacy

## 2019-05-20 ENCOUNTER — Other Ambulatory Visit: Payer: Self-pay

## 2019-05-20 ENCOUNTER — Ambulatory Visit (INDEPENDENT_AMBULATORY_CARE_PROVIDER_SITE_OTHER): Payer: Medicare Other

## 2019-05-20 DIAGNOSIS — J4540 Moderate persistent asthma, uncomplicated: Secondary | ICD-10-CM

## 2019-05-20 MED ORDER — BENRALIZUMAB 30 MG/ML ~~LOC~~ SOSY
30.0000 mg | PREFILLED_SYRINGE | Freq: Once | SUBCUTANEOUS | Status: AC
  Administered 2019-05-20: 30 mg via SUBCUTANEOUS

## 2019-05-20 NOTE — Progress Notes (Signed)
Immunotherapy   Patient Details  Name: Ryan Hanson MRN: [id number] Date of Birth: [birth year]  05/20/2019  [name] started Berna Bue for asthma. Patient received first dose 30 mg and waited in an exam room for 30 minutes with no problems. Frequency: every 4 weeks times 3 doses then every 8 weeks Epi-Pen: Yes Consent signed and patient instructions given.   Herbie Drape 05/20/2019, 1:34 PM

## 2019-05-27 ENCOUNTER — Other Ambulatory Visit: Payer: Self-pay | Admitting: Family Medicine

## 2019-05-30 ENCOUNTER — Other Ambulatory Visit: Payer: Self-pay | Admitting: Family Medicine

## 2019-06-17 ENCOUNTER — Ambulatory Visit: Payer: Self-pay

## 2019-06-22 ENCOUNTER — Ambulatory Visit: Payer: Medicare Other | Admitting: Family Medicine

## 2019-06-24 ENCOUNTER — Ambulatory Visit (INDEPENDENT_AMBULATORY_CARE_PROVIDER_SITE_OTHER): Payer: Medicare Other

## 2019-06-24 ENCOUNTER — Other Ambulatory Visit: Payer: Self-pay

## 2019-06-24 DIAGNOSIS — J4550 Severe persistent asthma, uncomplicated: Secondary | ICD-10-CM

## 2019-06-24 DIAGNOSIS — J4540 Moderate persistent asthma, uncomplicated: Secondary | ICD-10-CM

## 2019-06-24 MED ORDER — BENRALIZUMAB 30 MG/ML ~~LOC~~ SOSY
30.0000 mg | PREFILLED_SYRINGE | SUBCUTANEOUS | Status: DC
  Administered 2019-06-24 – 2019-07-29 (×2): 30 mg via SUBCUTANEOUS

## 2019-06-27 DIAGNOSIS — I10 Essential (primary) hypertension: Secondary | ICD-10-CM | POA: Diagnosis not present

## 2019-06-27 LAB — BASIC METABOLIC PANEL
BUN/Creatinine Ratio: 15 (calc) (ref 6–22)
BUN: 19 mg/dL (ref 7–25)
CO2: 31 mmol/L (ref 20–32)
Calcium: 9.3 mg/dL (ref 8.6–10.3)
Chloride: 101 mmol/L (ref 98–110)
Creat: 1.28 mg/dL — ABNORMAL HIGH (ref 0.70–1.11)
Glucose, Bld: 106 mg/dL — ABNORMAL HIGH (ref 65–99)
Potassium: 4.1 mmol/L (ref 3.5–5.3)
Sodium: 137 mmol/L (ref 135–146)

## 2019-06-28 ENCOUNTER — Encounter: Payer: Self-pay | Admitting: Emergency Medicine

## 2019-06-29 ENCOUNTER — Encounter: Payer: Self-pay | Admitting: Family Medicine

## 2019-06-29 ENCOUNTER — Other Ambulatory Visit: Payer: Self-pay

## 2019-06-29 ENCOUNTER — Ambulatory Visit (INDEPENDENT_AMBULATORY_CARE_PROVIDER_SITE_OTHER): Payer: Medicare Other | Admitting: Family Medicine

## 2019-06-29 VITALS — BP 119/70 | HR 99 | Temp 97.7°F | Ht 68.0 in | Wt 187.0 lb

## 2019-06-29 DIAGNOSIS — R609 Edema, unspecified: Secondary | ICD-10-CM | POA: Diagnosis not present

## 2019-06-29 DIAGNOSIS — I10 Essential (primary) hypertension: Secondary | ICD-10-CM | POA: Diagnosis not present

## 2019-06-29 DIAGNOSIS — E785 Hyperlipidemia, unspecified: Secondary | ICD-10-CM

## 2019-06-29 DIAGNOSIS — J4540 Moderate persistent asthma, uncomplicated: Secondary | ICD-10-CM | POA: Diagnosis not present

## 2019-06-29 NOTE — Patient Instructions (Addendum)
   Pt to contact [facility] for evaluation -cardiology  If you have lab work done today you will be contacted with your lab results within the next 2 weeks.  If you have not heard from [place] then please contact us. The fastest way to get your results is to register for My Chart.   IF you received an x-ray today, you will receive an invoice from [facility]. Please contact [facility] at [phone] with questions or concerns regarding your invoice.   IF you received labwork today, you will receive an invoice from [place]. Please contact [facility] at [phone] with questions or concerns regarding your invoice.   Our billing staff will not be able to assist you with questions regarding bills from these companies.  You will be contacted with the lab results as soon as they are available. The fastest way to get your results is to activate your My Chart account. Instructions are located on the last page of this paperwork. If you have not heard from [place] regarding the results in 2 weeks, please contact this office.

## 2019-06-29 NOTE — Progress Notes (Signed)
Established Patient Office Visit  Subjective:  Patient ID: Ryan Hanson, male    DOB: 03/28/1935  Age: 84 y.o. MRN: [id number]  CC:  Chief Complaint  Patient presents with  . Edema    f/u for edema in both legs and feet x 2 months. No better from last visit    HPI Ryan Hanson presents for LE edema-not improving. Now noted in the lower legs, feet and knees. Pt states improved overnight with less discoloration but swelling now restricting movement of the feet and knees. Pt with no CP/SOB-feels symbicort working well. Pt getting allergy shot monthly, zyrtec daily.   Past Medical History:  Diagnosis Date  . Allergy   . Angio-edema   . Asbestos exposure   . Cataract   . Chronic cough   . Dyspnea    chronic  . Exposure to other animate mechanical forces, initial encounter   . Heart murmur   . History of kidney stones   . Hyperlipidemia   . Hyperlipidemia, unspecified   . Localized swelling, mass and lump, right lower limb   . Moderate persistent asthma without complication 99991111  . Recurrent upper respiratory infection (URI)   . Unspecified chronic bronchitis ([place])     Past Surgical History:  Procedure Laterality Date  . CATARACT EXTRACTION W/PHACO Right 03/19/2015   Procedure: CATARACT EXTRACTION PHACO AND INTRAOCULAR LENS PLACEMENT; CDE:  9.70;  Surgeon: Williams Che, MD;  Location: [facility];  Service: Ophthalmology;  Laterality: Right;  . CATARACT EXTRACTION W/PHACO Left 03/16/2017   Procedure: CATARACT EXTRACTION PHACO AND INTRAOCULAR LENS PLACEMENT (IOC);  Surgeon: Tonny Branch, MD;  Location: [facility];  Service: Ophthalmology;  Laterality: Left;  CDE: 26.41  . EYE SURGERY      Family History  Problem Relation Age of Onset  . Cancer Mother   . COPD Father   . Allergic rhinitis Neg Hx   . Angioedema Neg Hx   . Asthma Neg Hx   . Atopy Neg Hx   . Eczema Neg Hx   . Immunodeficiency Neg Hx   . Urticaria Neg Hx     Social History   Socioeconomic History   . Marital status: Widowed    Spouse name: Not on file  . Number of children: Not on file  . Years of education: Not on file  . Highest education level: Not on file  Occupational History  . Not on file  Tobacco Use  . Smoking status: Former Smoker    Packs/day: 2.00    Years: 30.00    Pack years: 60.00    Types: Cigarettes    Quit date: 03/12/1978    Years since quitting: 41.3  . Smokeless tobacco: Never Used  Substance and Sexual Activity  . Alcohol use: No  . Drug use: No  . Sexual activity: Not Currently    Birth control/protection: None  Other Topics Concern  . Not on file  Social History Narrative  . Not on file   Social Determinants of Health   Financial Resource Strain:   . Difficulty of Paying Living Expenses:   Food Insecurity:   . Worried About Charity fundraiser in the Last Year:   . Arboriculturist in the Last Year:   Transportation Needs:   . Film/video editor (Medical):   Marland Kitchen Lack of Transportation (Non-Medical):   Physical Activity:   . Days of Exercise per Week:   . Minutes of Exercise per Session:   Stress:   .  Feeling of Stress :   Social Connections:   . Frequency of Communication with Friends and Family:   . Frequency of Social Gatherings with Friends and Family:   . Attends Religious Services:   . Active Member of Clubs or Organizations:   . Attends Archivist Meetings:   Marland Kitchen Marital Status:   Intimate Partner Violence:   . Fear of Current or Ex-Partner:   . Emotionally Abused:   Marland Kitchen Physically Abused:   . Sexually Abused:     Outpatient Medications Prior to Visit  Medication Sig Dispense Refill  . acetaminophen (TYLENOL) 500 MG tablet Take 1,000 mg by mouth daily.    Marland Kitchen atorvastatin (LIPITOR) 20 MG tablet Take 0.5 tablets (10 mg total) by mouth daily. 90 tablet 1  . budesonide-formoterol (SYMBICORT) 160-4.5 MCG/ACT inhaler Inhale 2 puffs into the lungs 2 (two) times daily.    . cetirizine (ZYRTEC) 10 MG tablet Take 10 mg by  mouth daily.    . cholecalciferol (VITAMIN D) 1000 units tablet Take 1,000 Units by mouth daily.    . furosemide (LASIX) 40 MG tablet TAKE 1 TABLET(40 MG) BY MOUTH DAILY 30 tablet 0  . Benralizumab SOSY 30 mg      No facility-administered medications prior to visit.    Allergies  Allergen Reactions  . Ceftin [Cefuroxime Axetil]     ROS Review of Systems  Constitutional: Negative for fatigue and fever.  HENT: Negative for congestion, rhinorrhea and sneezing.   Eyes: Negative.   Respiratory: Negative.  Negative for cough, chest tightness and wheezing.   Cardiovascular: Positive for leg swelling.  Gastrointestinal: Negative.   Genitourinary:       Nocturia x 2  Musculoskeletal: Positive for gait problem.       Swelling noted knees, ankles, feet and lower legs  Allergic/Immunologic: Positive for environmental allergies.  Psychiatric/Behavioral: Negative.       Objective:    Physical Exam  Constitutional: He is oriented to person, place, and time. He appears well-developed and well-nourished.  HENT:  Head: Normocephalic and atraumatic.  Eyes: Conjunctivae are normal.  Cardiovascular: Normal rate, regular rhythm and intact distal pulses.  Murmur heard. Pulmonary/Chest: Effort normal and breath sounds normal.  Musculoskeletal:        General: Edema present.     Right knee: Swelling present.     Left knee: Swelling present.     Right ankle: Swelling present.     Left ankle: Swelling present.     Comments: bilat swelling above the knee to ankles and feet  Neurological: He is alert and oriented to person, place, and time.  Psychiatric: He has a normal mood and affect. His behavior is normal.    BP 119/70 (BP Location: Right Arm, Patient Position: Sitting, Cuff Size: Normal)   Pulse 99   Temp 97.7 F (36.5 C) (Temporal)   Ht 5\' 8"  (1.727 m)   Wt 187 lb (84.8 kg)   SpO2 96%   BMI 28.43 kg/m  Wt Readings from Last 3 Encounters:  06/29/19 187 lb (84.8 kg)  05/18/19 187  lb 12.8 oz (85.2 kg)  05/03/19 192 lb (87.1 kg)     Health Maintenance Due  Topic Date Due  . COVID-19 Vaccine (1) Never done  . PNA vac Low Risk Adult (2 of 2 - PPSV23) 10/28/2014     Lab Results  Component Value Date   TSH 1.99 11/10/2018   Lab Results  Component Value Date   WBC 7.4 05/02/2019  HGB 14.6 05/02/2019   HCT 43.3 05/02/2019   MCV 92.9 05/02/2019   PLT 237 05/02/2019   Lab Results  Component Value Date   NA 137 06/27/2019   K 4.1 06/27/2019   CO2 31 06/27/2019   GLUCOSE 106 (H) 06/27/2019   BUN 19 06/27/2019   CREATININE 1.28 (H) 06/27/2019   BILITOT 0.7 05/02/2019   ALKPHOS 123 (H) 04/06/2019   AST 18 05/02/2019   ALT 19 05/02/2019   PROT 5.9 (L) 05/02/2019   ALBUMIN 4.0 04/06/2019   CALCIUM 9.3 06/27/2019   ANIONGAP 8 03/13/2015   Lab Results  Component Value Date   CHOL 174 05/02/2019   Lab Results  Component Value Date   HDL 51 05/02/2019   Lab Results  Component Value Date   LDLCALC 99 05/02/2019   Lab Results  Component Value Date   TRIG 142 05/02/2019   Lab Results  Component Value Date   CHOLHDL 3.4 05/02/2019   Lab Results  Component Value Date   HGBA1C 5.5 05/02/2019      Assessment & Plan:  1. Essential hypertension Stable with lasix daily-K and Cr stablized  2. Moderate persistent asthma without complication Allergy following-shot has improved symptoms  3. Edema, peripheral No improvement with elevation, lasix-swelling above knees with no complete resolution after elevation overnight-murmur louder on auscultation  ecg no change, cxr no change, K and Cr stable with lasix 40mg  4. Hyperlipidemia, unspecified hyperlipidemia type lipitor stable [facility]  Laretta Pyatt Hannah Beat, MD

## 2019-07-05 ENCOUNTER — Other Ambulatory Visit: Payer: Self-pay | Admitting: Family Medicine

## 2019-07-05 NOTE — Telephone Encounter (Signed)
Would this be okay to refill?

## 2019-07-06 ENCOUNTER — Other Ambulatory Visit: Payer: Self-pay | Admitting: Family Medicine

## 2019-07-22 ENCOUNTER — Ambulatory Visit: Payer: Self-pay

## 2019-07-29 ENCOUNTER — Ambulatory Visit (INDEPENDENT_AMBULATORY_CARE_PROVIDER_SITE_OTHER): Payer: Medicare Other

## 2019-07-29 DIAGNOSIS — J4540 Moderate persistent asthma, uncomplicated: Secondary | ICD-10-CM

## 2019-07-29 DIAGNOSIS — J4550 Severe persistent asthma, uncomplicated: Secondary | ICD-10-CM

## 2019-08-26 ENCOUNTER — Ambulatory Visit (INDEPENDENT_AMBULATORY_CARE_PROVIDER_SITE_OTHER): Payer: Medicare Other

## 2019-08-26 DIAGNOSIS — J4550 Severe persistent asthma, uncomplicated: Secondary | ICD-10-CM | POA: Diagnosis not present

## 2019-08-26 MED ORDER — BENRALIZUMAB 30 MG/ML ~~LOC~~ SOSY
30.0000 mg | PREFILLED_SYRINGE | Freq: Once | SUBCUTANEOUS | Status: AC
  Administered 2019-08-26: 30 mg via SUBCUTANEOUS

## 2019-10-04 ENCOUNTER — Other Ambulatory Visit: Payer: Self-pay | Admitting: *Deleted

## 2019-10-04 ENCOUNTER — Ambulatory Visit (INDEPENDENT_AMBULATORY_CARE_PROVIDER_SITE_OTHER): Payer: Medicare Other | Admitting: Family Medicine

## 2019-10-04 ENCOUNTER — Encounter: Payer: Self-pay | Admitting: Family Medicine

## 2019-10-04 ENCOUNTER — Other Ambulatory Visit: Payer: Self-pay

## 2019-10-04 VITALS — BP 139/77 | HR 70 | Temp 97.3°F | Resp 16 | Ht 69.0 in | Wt 179.8 lb

## 2019-10-04 DIAGNOSIS — I10 Essential (primary) hypertension: Secondary | ICD-10-CM | POA: Diagnosis not present

## 2019-10-04 DIAGNOSIS — R011 Cardiac murmur, unspecified: Secondary | ICD-10-CM

## 2019-10-04 DIAGNOSIS — E785 Hyperlipidemia, unspecified: Secondary | ICD-10-CM

## 2019-10-04 DIAGNOSIS — J4540 Moderate persistent asthma, uncomplicated: Secondary | ICD-10-CM

## 2019-10-04 DIAGNOSIS — R609 Edema, unspecified: Secondary | ICD-10-CM | POA: Diagnosis not present

## 2019-10-04 MED ORDER — FUROSEMIDE 40 MG PO TABS: ORAL_TABLET | ORAL | 0 refills | Status: DC

## 2019-10-04 MED ORDER — ATORVASTATIN CALCIUM 20 MG PO TABS: 10.0000 mg | ORAL_TABLET | Freq: Every day | ORAL | 1 refills | Status: DC

## 2019-10-04 NOTE — Assessment & Plan Note (Signed)
Is followed closely by Dr. Ernst Bowler.  Reports that he does not have any breathing issues uses his medication as directed.

## 2019-10-04 NOTE — Assessment & Plan Note (Signed)
Reports never being told he had BP issues. He is on the higher end of normal, is on lasix for now.  Encouraged DASH diet and exercise.

## 2019-10-04 NOTE — Assessment & Plan Note (Signed)
He is encouraged to continue taking his Lipitor.  Levels were in normal range in March.  We will get recheck at annual appointment

## 2019-10-04 NOTE — Assessment & Plan Note (Signed)
Noted murmur on exam.  He reports that he had a chest x-ray, echoes and EKGs at the [facility] for work-up of congestive heart failure.  Secondary to having leg swelling.  He does not report that they found anything at this time.  He is on a baby aspirin and compression as needed.

## 2019-10-04 NOTE — Assessment & Plan Note (Signed)
Has bilateral lower extremity edema predominantly more on the left than the right.  He reports today that it is more in the foot area that compression socks are helping and the use of Lasix.

## 2019-10-04 NOTE — Progress Notes (Signed)
Subjective:  Patient ID: Ryan Hanson, male    DOB: 1935/08/04  Age: 84 y.o. MRN: [id number]  CC:  Chief Complaint  Patient presents with  . New Patient (Initial Visit)    new pt former dr Holly Bodily pt goes to [place] also just needs local primary care       HPI  HPI  [name] is an 84 year old male patient of Dr. Luan Pulling and Holly Bodily presents to establish care today.  Also is seen at the [place] every couple months.  He reports he is taking all his medications as directed and has been out and needs refills.  Divided today.  He reports that he is being worked up earlier this year at the [place] secondary to having some leg swelling.  EKG echo and chest x-ray he reports nothing was demonstrated for heart failure he was started on baby aspirin and compression socks.  He continues to take Lasix as well.  He denies having any sleep issues.  He denies having any trouble with his teeth or chewing or appetite changes.  Sees a dentist regularly.  Denies having any prostate issues or changes in bowel or bladder habits.  No blood in urine or stool.  Denies having any memory confusion does have some forgetfulness.  Denies having any falls or injuries.  Denies having any skin issues.  Reports that he does have hearing aids and bilateral hearing problems.  Reports he wears glasses and gets his visits yearly.  Is followed by Dr. Ernst Bowler for allergies and asthma.  Has had exposure to asbestos.  Dr. Eartha Inch had reported that he had sleep apnea and chronic bronchitis.  He usually gets his vaccines.  Did get his Covid vaccine as well.  He is very active goes to the gym 4 times a week.  He can also take the care of himself.  He lives alone but has a dog named Actor.  And his son lives 15 miles away.  Widowed 5 years ago.   Today patient denies signs and symptoms of COVID 19 infection including fever, chills, cough, shortness of breath, and headache. Past Medical, Surgical, Social History, Allergies, and Medications have  been Reviewed.   Past Medical History:  Diagnosis Date  . Allergy   . Angio-edema   . Asbestos exposure   . Cataract   . Chronic cough   . Dyspnea    chronic  . Exposure to other animate mechanical forces, initial encounter   . Heart murmur   . History of kidney stones   . Hyperlipidemia   . Hyperlipidemia, unspecified   . Localized swelling, mass and lump, right lower limb   . Moderate persistent asthma without complication 1/49/7026  . OSA (obstructive sleep apnea) 03/10/2019  . Recurrent upper respiratory infection (URI)   . Swelling of throat 02/24/2019  . Unspecified chronic bronchitis ([facility])     Current Meds  Medication Sig  . acetaminophen (TYLENOL) 500 MG tablet Take 1,000 mg by mouth daily.  . cetirizine (ZYRTEC) 10 MG tablet Take 10 mg by mouth daily.  . cholecalciferol (VITAMIN D) 1000 units tablet Take 1,000 Units by mouth daily.  . [DISCONTINUED] atorvastatin (LIPITOR) 20 MG tablet Take 0.5 tablets (10 mg total) by mouth daily.  . [DISCONTINUED] furosemide (LASIX) 40 MG tablet TAKE 1 TABLET(40 MG) BY MOUTH DAILY    ROS:  Review of Systems  Constitutional: Negative.   HENT: Negative.   Eyes: Negative.   Respiratory: Negative.   Cardiovascular: Negative.  Gastrointestinal: Negative.   Genitourinary: Negative.   Musculoskeletal: Negative.   Skin: Negative.   Neurological: Negative.   Endo/Heme/Allergies: Negative.   Psychiatric/Behavioral: Negative.   All other systems reviewed and are negative.    Objective:   Today's Vitals: BP 139/77 (BP Location: Left Arm, Patient Position: Sitting, Cuff Size: Normal)   Pulse 70   Temp (!) 97.3 F (36.3 C) (Temporal)   Resp 16   Ht 5\' 9"  (1.753 m)   Wt 179 lb 12.8 oz (81.6 kg)   SpO2 97%   BMI 26.55 kg/m  Vitals with BMI 10/04/2019 06/29/2019 05/18/2019  Height 5\' 9"  5\' 8"  5\' 7"   Weight 179 lbs 13 oz 187 lbs 187 lbs 13 oz  BMI 26.54 47.65 46.50  Systolic 354 656 812  Diastolic 77 70 71  Pulse 70 99 71       Physical Exam Vitals and nursing note reviewed.  Constitutional:      Appearance: Normal appearance. He is well-developed, well-groomed and overweight.  HENT:     Head: Normocephalic and atraumatic.     Right Ear: External ear normal.     Left Ear: External ear normal.     Mouth/Throat:     Comments: Mask in place  Eyes:     General:        Right eye: No discharge.        Left eye: No discharge.     Conjunctiva/sclera: Conjunctivae normal.  Cardiovascular:     Rate and Rhythm: Normal rate and regular rhythm.     Pulses: Normal pulses.     Heart sounds: Murmur heard.   Pulmonary:     Effort: Pulmonary effort is normal.     Breath sounds: Normal breath sounds.  Musculoskeletal:        General: Normal range of motion.     Cervical back: Normal range of motion and neck supple.  Skin:    General: Skin is warm.  Neurological:     General: No focal deficit present.     Mental Status: He is alert and oriented to person, place, and time.  Psychiatric:        Attention and Perception: Attention normal.        Mood and Affect: Mood normal.        Speech: Speech normal.        Behavior: Behavior normal. Behavior is cooperative.        Thought Content: Thought content normal.        Cognition and Memory: Cognition normal.        Judgment: Judgment normal.      Assessment   1. Moderate persistent asthma without complication   2. Hyperlipidemia, unspecified hyperlipidemia type   3. Edema, peripheral   4. Essential hypertension   5. Murmur     Tests ordered No orders of the defined types were placed in this encounter.    Plan: Please see assessment and plan per problem list above.   No orders of the defined types were placed in this encounter.   Patient to follow-up in 6 months   Perlie Mayo, NP

## 2019-10-04 NOTE — Patient Instructions (Addendum)
I appreciate the opportunity to provide you with care for your health and wellness. Today we discussed: established care  Follow up: 6 months for Annual visit   No labs or referrals today  NICE TO MEET YOU TODAY!  Please continue to practice social distancing to keep you, your family, and our community safe.  If you must go out, please wear a mask and practice good handwashing.  It was a pleasure to see you and I look forward to continuing to work together on your health and well-being. Please do not hesitate to call the office if you need care or have questions about your care.  Have a wonderful day and week. With Gratitude, Cherly Beach, DNP, AGNP-BC

## 2019-10-21 ENCOUNTER — Other Ambulatory Visit: Payer: Self-pay

## 2019-10-21 ENCOUNTER — Ambulatory Visit (INDEPENDENT_AMBULATORY_CARE_PROVIDER_SITE_OTHER): Payer: Medicare Other

## 2019-10-21 DIAGNOSIS — J4550 Severe persistent asthma, uncomplicated: Secondary | ICD-10-CM | POA: Diagnosis not present

## 2019-10-21 DIAGNOSIS — J4540 Moderate persistent asthma, uncomplicated: Secondary | ICD-10-CM

## 2019-10-21 MED ORDER — BENRALIZUMAB 30 MG/ML ~~LOC~~ SOSY
30.0000 mg | PREFILLED_SYRINGE | Freq: Once | SUBCUTANEOUS | Status: AC
  Administered 2019-10-21: 30 mg via SUBCUTANEOUS

## 2019-12-16 ENCOUNTER — Ambulatory Visit (INDEPENDENT_AMBULATORY_CARE_PROVIDER_SITE_OTHER): Payer: Medicare Other

## 2019-12-16 DIAGNOSIS — J4550 Severe persistent asthma, uncomplicated: Secondary | ICD-10-CM | POA: Diagnosis not present

## 2019-12-16 MED ORDER — BENRALIZUMAB 30 MG/ML ~~LOC~~ SOSY
30.0000 mg | PREFILLED_SYRINGE | SUBCUTANEOUS | Status: DC
  Administered 2019-12-16 – 2023-12-21 (×27): 30 mg via SUBCUTANEOUS

## 2020-01-05 ENCOUNTER — Ambulatory Visit: Payer: Medicare Other | Attending: Internal Medicine

## 2020-01-05 DIAGNOSIS — Z23 Encounter for immunization: Secondary | ICD-10-CM

## 2020-01-05 NOTE — Progress Notes (Signed)
   Covid-19 Vaccination Clinic  Name:  LARWENCE TU    MRN: [id number] DOB: [birth year]  01/05/2020  Mr. Lappe was observed post Covid-19 immunization for 15 minutes without incident. He was provided with Vaccine Information Sheet and instruction to access the V-Safe system.   Mr. Tortorella was instructed to call 911 with any severe reactions post vaccine: Marland Kitchen Difficulty breathing  . Swelling of face and throat  . A fast heartbeat  . A bad rash all over body  . Dizziness and weakness

## 2020-02-10 ENCOUNTER — Other Ambulatory Visit: Payer: Self-pay

## 2020-02-10 ENCOUNTER — Ambulatory Visit (INDEPENDENT_AMBULATORY_CARE_PROVIDER_SITE_OTHER): Payer: Medicare Other

## 2020-02-10 DIAGNOSIS — J4550 Severe persistent asthma, uncomplicated: Secondary | ICD-10-CM | POA: Diagnosis not present

## 2020-02-10 DIAGNOSIS — J4540 Moderate persistent asthma, uncomplicated: Secondary | ICD-10-CM

## 2020-02-20 ENCOUNTER — Ambulatory Visit: Payer: Medicare Other | Admitting: Family Medicine

## 2020-04-05 ENCOUNTER — Encounter: Payer: Medicare Other | Admitting: Family Medicine

## 2020-04-06 ENCOUNTER — Other Ambulatory Visit: Payer: Self-pay

## 2020-04-06 ENCOUNTER — Ambulatory Visit (INDEPENDENT_AMBULATORY_CARE_PROVIDER_SITE_OTHER): Payer: Medicare Other

## 2020-04-06 DIAGNOSIS — J4540 Moderate persistent asthma, uncomplicated: Secondary | ICD-10-CM

## 2020-04-06 DIAGNOSIS — J4550 Severe persistent asthma, uncomplicated: Secondary | ICD-10-CM

## 2020-04-10 ENCOUNTER — Encounter: Payer: Self-pay | Admitting: Nurse Practitioner

## 2020-04-10 ENCOUNTER — Other Ambulatory Visit: Payer: Self-pay

## 2020-04-10 ENCOUNTER — Ambulatory Visit (INDEPENDENT_AMBULATORY_CARE_PROVIDER_SITE_OTHER): Payer: Medicare Other | Admitting: Nurse Practitioner

## 2020-04-10 DIAGNOSIS — J4540 Moderate persistent asthma, uncomplicated: Secondary | ICD-10-CM | POA: Diagnosis not present

## 2020-04-10 DIAGNOSIS — R011 Cardiac murmur, unspecified: Secondary | ICD-10-CM

## 2020-04-10 DIAGNOSIS — J3089 Other allergic rhinitis: Secondary | ICD-10-CM

## 2020-04-10 DIAGNOSIS — Z0000 Encounter for general adult medical examination without abnormal findings: Secondary | ICD-10-CM

## 2020-04-10 DIAGNOSIS — I350 Nonrheumatic aortic (valve) stenosis: Secondary | ICD-10-CM | POA: Diagnosis not present

## 2020-04-10 DIAGNOSIS — R609 Edema, unspecified: Secondary | ICD-10-CM

## 2020-04-10 DIAGNOSIS — J302 Other seasonal allergic rhinitis: Secondary | ICD-10-CM | POA: Diagnosis not present

## 2020-04-10 DIAGNOSIS — E785 Hyperlipidemia, unspecified: Secondary | ICD-10-CM | POA: Diagnosis not present

## 2020-04-10 DIAGNOSIS — I10 Essential (primary) hypertension: Secondary | ICD-10-CM | POA: Diagnosis not present

## 2020-04-10 DIAGNOSIS — Z77090 Contact with and (suspected) exposure to asbestos: Secondary | ICD-10-CM

## 2020-04-10 NOTE — Assessment & Plan Note (Signed)
-  resolved after swapping form [employer]eps[employer] to water -no longer needs lasix

## 2020-04-10 NOTE — Assessment & Plan Note (Signed)
-  followed by cardiology with the [facility]

## 2020-04-10 NOTE — Patient Instructions (Signed)
It was great meeting you today.  Please get fasting labs drawn at your earliest convenience. We are getting routine labs as part of your physical.  Keep up the good work at the gym!

## 2020-04-10 NOTE — Assessment & Plan Note (Signed)
-  takes zyrtec as needed

## 2020-04-10 NOTE — Assessment & Plan Note (Signed)
-  followed by cardiology with the [facility]

## 2020-04-10 NOTE — Assessment & Plan Note (Signed)
-  up to date on immunizations and screenings

## 2020-04-10 NOTE — Progress Notes (Addendum)
Established Patient Office Visit  Subjective:  Patient ID: Ryan Hanson, male    DOB: 07-Nov-1935  Age: 85 y.o. MRN: [id number]  CC:  Chief Complaint  Patient presents with  . Annual Exam    HPI [name] presents for physical exam. He states he had echo, stress test, E[facility], [facility]. He had a heart valve issue that was worked-up by the [place]. He states he stopped drinking Pepsi and started drinking water and that fixed his issue with swelling in his feet.    Past Medical History:  Diagnosis Date  . Allergy   . Angio-edema   . Asbestos exposure   . Cataract   . Chronic cough   . Dyspnea    chronic  . Exposure to other animate mechanical forces, initial encounter   . Heart murmur   . History of kidney stones   . Hyperlipidemia   . Hyperlipidemia, unspecified   . Localized swelling, mass and lump, right lower limb   . Moderate persistent asthma without complication 08/17/735  . OSA (obstructive sleep apnea) 03/10/2019  . Recurrent upper respiratory infection (URI)   . Swelling of throat 02/24/2019  . Unspecified chronic bronchitis ([place])     Past Surgical History:  Procedure Laterality Date  . CATARACT EXTRACTION W/PHACO Right 03/19/2015   Procedure: CATARACT EXTRACTION PHACO AND INTRAOCULAR LENS PLACEMENT; CDE:  9.70;  Surgeon: Williams Che, MD;  Location: [facility];  Service: Ophthalmology;  Laterality: Right;  . CATARACT EXTRACTION W/PHACO Left 03/16/2017   Procedure: CATARACT EXTRACTION PHACO AND INTRAOCULAR LENS PLACEMENT (IOC);  Surgeon: Tonny Branch, MD;  Location: [facility];  Service: Ophthalmology;  Laterality: Left;  CDE: 26.41  . EYE SURGERY      Family History  Problem Relation Age of Onset  . Cancer Mother   . COPD Father   . Allergic rhinitis Neg Hx   . Angioedema Neg Hx   . Asthma Neg Hx   . Atopy Neg Hx   . Eczema Neg Hx   . Immunodeficiency Neg Hx   . Urticaria Neg Hx     Social History   Socioeconomic History   . Marital status: Widowed    Spouse name: Not on file  . Number of children: 1  . Years of education: Not on file  . Highest education level: Not on file  Occupational History    Comment: Retired, Education officer, museum   Tobacco Use  . Smoking status: Former Smoker    Packs/day: 2.00    Years: 30.00    Pack years: 60.00    Types: Cigarettes    Quit date: 03/12/1978    Years since quitting: 42.1  . Smokeless tobacco: Never Used  Vaping Use  . Vaping Use: Never used  Substance and Sexual Activity  . Alcohol use: No  . Drug use: No  . Sexual activity: Not Currently    Birth control/protection: None  Other Topics Concern  . Not on file  Social History Narrative   Lives alone    Dog: Celenia- yorkie       Son lives close       Enjoys: helping people with things, gym work       Diet: eats all food groups    Caffeine: decaf    Water: 6-8 cups a day       Wears seat belt   Does not use phone while driving    Smoke detectors  Public house manager in home, safe area    Social Determinants of Radio broadcast assistant Strain: Low Risk   . Difficulty of Paying Living Expenses: Not hard at all  Food Insecurity: No Food Insecurity  . Worried About Charity fundraiser in the Last Year: Never true  . Ran Out of Food in the Last Year: Never true  Transportation Needs: No Transportation Needs  . Lack of Transportation (Medical): No  . Lack of Transportation (Non-Medical): No  Physical Activity: Insufficiently Active  . Days of Exercise per Week: 3 days  . Minutes of Exercise per Session: 30 min  Stress: No Stress Concern Present  . Feeling of Stress : Not at all  Social Connections: Moderately Isolated  . Frequency of Communication with Friends and Family: More than three times a week  . Frequency of Social Gatherings with Friends and Family: More than three times a week  . Attends Religious Services: More than 4 times per year  . Active Member of Clubs or  Organizations: No  . Attends Archivist Meetings: Never  . Marital Status: Widowed  Intimate Partner Violence: Not At Risk  . Fear of Current or Ex-Partner: No  . Emotionally Abused: No  . Physically Abused: No  . Sexually Abused: No    Outpatient Medications Prior to Visit  Medication Sig Dispense Refill  . acetaminophen (TYLENOL) 500 MG tablet Take 1,000 mg by mouth daily.    Marland Kitchen atorvastatin (LIPITOR) 20 MG tablet Take 0.5 tablets (10 mg total) by mouth daily. 90 tablet 1  . cetirizine (ZYRTEC) 10 MG tablet Take 10 mg by mouth daily.    . budesonide-formoterol (SYMBICORT) 160-4.5 MCG/ACT inhaler Inhale 2 puffs into the lungs 2 (two) times daily. (Patient not taking: Reported on 04/10/2020)    . cholecalciferol (VITAMIN D) 1000 units tablet Take 1,000 Units by mouth daily. (Patient not taking: Reported on 04/10/2020)    . furosemide (LASIX) 40 MG tablet TAKE 1 TABLET(40 MG) BY MOUTH DAILY (Patient not taking: Reported on 04/10/2020) 90 tablet 0   Facility-Administered Medications Prior to Visit  Medication Dose Route Frequency Provider Last Rate Last Admin  . Benralizumab SOSY 30 mg  30 mg Subcutaneous Q8 Toney Reil, MD   30 mg at 04/06/20 1011    Allergies  Allergen Reactions  . Ceftin [Cefuroxime Axetil]     ROS Review of Systems  Constitutional: Negative.   HENT: Negative.   Eyes: Negative.   Respiratory: Negative.   Cardiovascular: Negative.   Gastrointestinal: Negative.   Endocrine: Negative.   Genitourinary: Negative.   Musculoskeletal: Negative.   Skin: Negative.   Neurological: Negative.   Hematological: Negative.   Psychiatric/Behavioral: Negative.       Objective:    Physical Exam Constitutional:      Appearance: Normal appearance.  HENT:     Head: Normocephalic and atraumatic.     Right Ear: Tympanic membrane, ear canal and external ear normal.     Left Ear: Tympanic membrane, ear canal and external ear normal.     Nose: Nose  normal.     Mouth/Throat:     Mouth: Mucous membranes are moist.     Pharynx: Oropharynx is clear.  Eyes:     Extraocular Movements: Extraocular movements intact.     Conjunctiva/sclera: Conjunctivae normal.     Pupils: Pupils are equal, round, and reactive to light.  Cardiovascular:     Rate and Rhythm: Normal rate and  regular rhythm.     Heart sounds: Murmur heard.      Comments: 3/6 blowing murmur loudest at aorta Pulmonary:     Effort: Pulmonary effort is normal.     Breath sounds: Normal breath sounds.  Abdominal:     General: Abdomen is flat. Bowel sounds are normal.     Palpations: Abdomen is soft.  Musculoskeletal:        General: Normal range of motion.     Cervical back: Normal range of motion and neck supple.  Skin:    General: Skin is warm and dry.     Capillary Refill: Capillary refill takes less than 2 seconds.  Neurological:     General: No focal deficit present.     Mental Status: He is alert and oriented to person, place, and time.     Cranial Nerves: No cranial nerve deficit.     Sensory: No sensory deficit.     Motor: No weakness.     Coordination: Coordination normal.     Gait: Gait normal.  Psychiatric:        Mood and Affect: Mood normal.        Behavior: Behavior normal.        Thought Content: Thought content normal.        Judgment: Judgment normal.     BP 118/60   Pulse 96   Temp 97.7 F (36.5 C)   Resp 20   Ht $R'5\' 8"'dv$  (1.727 m)   Wt 179 lb (81.2 kg)   SpO2 95%   BMI 27.22 kg/m  Wt Readings from Last 3 Encounters:  04/10/20 179 lb (81.2 kg)  10/04/19 179 lb 12.8 oz (81.6 kg)  06/29/19 187 lb (84.8 kg)     There are no preventive care reminders to display for this patient.  There are no preventive care reminders to display for this patient.  Lab Results  Component Value Date   TSH 1.99 11/10/2018   Lab Results  Component Value Date   WBC 7.4 05/02/2019   HGB 14.6 05/02/2019   HCT 43.3 05/02/2019   MCV 92.9 05/02/2019   PLT  237 05/02/2019   Lab Results  Component Value Date   NA 137 06/27/2019   K 4.1 06/27/2019   CO2 31 06/27/2019   GLUCOSE 106 (H) 06/27/2019   BUN 19 06/27/2019   CREATININE 1.28 (H) 06/27/2019   BILITOT 0.7 05/02/2019   ALKPHOS 123 (H) 04/06/2019   AST 18 05/02/2019   ALT 19 05/02/2019   PROT 5.9 (L) 05/02/2019   ALBUMIN 4.0 04/06/2019   CALCIUM 9.3 06/27/2019   ANIONGAP 8 03/13/2015   Lab Results  Component Value Date   CHOL 174 05/02/2019   Lab Results  Component Value Date   HDL 51 05/02/2019   Lab Results  Component Value Date   LDLCALC 99 05/02/2019   Lab Results  Component Value Date   TRIG 142 05/02/2019   Lab Results  Component Value Date   CHOLHDL 3.4 05/02/2019   Lab Results  Component Value Date   HGBA1C 5.5 05/02/2019      Assessment & Plan:   Problem List Items Addressed This Visit      Cardiovascular and Mediastinum   Essential hypertension    -BP well controlled today -no longer on BP meds      Aortic stenosis    -[facility]        Respiratory   Moderate persistent asthma without  complication    -followed by Dr. Ernst Bowler -no longer using symbicort, and doing well      Seasonal and perennial allergic rhinitis    -takes zyrtec as needed        Other   Hyperlipidemia, unspecified    -will check with labs -taking atorvastatin 20 mg daily -exercises daily      Asbestos exposure    -[facility]      Edema, peripheral    -resolved after swapping form Pepsi to water -no longer needs lasix      Murmur    -followed by cardiology with the [place]      Annual physical exam    -up to date on immunizations and screenings      Relevant Orders   CBC with Differential/Platelet   CMP14+EGFR   Lipid Panel With LDL/HDL Ratio      No orders of the defined types were placed in this encounter.   Follow-up: Return in about 1 year (around 04/10/2021) for Physical exam.    Noreene Larsson, NP

## 2020-04-10 NOTE — Assessment & Plan Note (Signed)
-  followed by [facility]

## 2020-04-10 NOTE — Assessment & Plan Note (Signed)
-  followed by Dr. Ernst Bowler -no longer using symbicort, and doing well

## 2020-04-10 NOTE — Assessment & Plan Note (Signed)
-  will check with labs -taking atorvastatin 20 mg daily -exercises daily

## 2020-04-10 NOTE — Assessment & Plan Note (Signed)
-  BP well controlled today -no longer on BP meds

## 2020-06-01 ENCOUNTER — Other Ambulatory Visit: Payer: Self-pay

## 2020-06-01 ENCOUNTER — Ambulatory Visit (INDEPENDENT_AMBULATORY_CARE_PROVIDER_SITE_OTHER): Payer: Medicare Other

## 2020-06-01 DIAGNOSIS — J4550 Severe persistent asthma, uncomplicated: Secondary | ICD-10-CM

## 2020-06-01 DIAGNOSIS — J4540 Moderate persistent asthma, uncomplicated: Secondary | ICD-10-CM

## 2020-06-13 ENCOUNTER — Telehealth: Payer: Self-pay | Admitting: Family Medicine

## 2020-06-13 NOTE — Telephone Encounter (Signed)
Left message for patient to call back and schedule Medicare Annual Wellness Visit (AWV) either virtually or in office.   AWV-I PER [place] 03/03/09   please schedule at anytime with [facility]  health coach  This should be a 40 minute visit.

## 2020-07-27 ENCOUNTER — Other Ambulatory Visit: Payer: Self-pay

## 2020-07-27 ENCOUNTER — Ambulatory Visit (INDEPENDENT_AMBULATORY_CARE_PROVIDER_SITE_OTHER): Payer: Medicare Other

## 2020-07-27 DIAGNOSIS — J4550 Severe persistent asthma, uncomplicated: Secondary | ICD-10-CM

## 2020-07-27 DIAGNOSIS — J4540 Moderate persistent asthma, uncomplicated: Secondary | ICD-10-CM

## 2020-08-14 ENCOUNTER — Telehealth: Payer: Self-pay | Admitting: Family Medicine

## 2020-08-14 NOTE — Telephone Encounter (Signed)
Left message for patient to call back and schedule Medicare Annual Wellness Visit (AWV) either virtually or in office.   AWV-I PER [place] 03/03/09  please schedule at anytime with [facility]  health coach  This should be a 40 minute visit.

## 2020-08-15 NOTE — Telephone Encounter (Signed)
Patient scheduled for AWV  08/27/20 in office.

## 2020-08-27 ENCOUNTER — Other Ambulatory Visit: Payer: Self-pay

## 2020-08-27 ENCOUNTER — Ambulatory Visit (INDEPENDENT_AMBULATORY_CARE_PROVIDER_SITE_OTHER): Payer: Medicare Other

## 2020-08-27 VITALS — BP 132/71 | HR 76 | Temp 98.2°F | Ht 68.0 in | Wt 179.2 lb

## 2020-08-27 DIAGNOSIS — Z0000 Encounter for general adult medical examination without abnormal findings: Secondary | ICD-10-CM | POA: Diagnosis not present

## 2020-08-27 NOTE — Patient Instructions (Signed)
Ryan Hanson , Thank you for taking time to come for your Medicare Wellness Visit. I appreciate your ongoing commitment to your health goals. Please review the following plan we discussed and let me know if I can assist you in the future.   Screening recommendations/referrals: Colonoscopy: No longer required  Recommended yearly ophthalmology/optometry visit for glaucoma screening and checkup Recommended yearly dental visit for hygiene and checkup  Vaccinations: Influenza vaccine: Up to date, next due fall 2022  Pneumococcal vaccine: Completed series  Tdap vaccine: Up to date, next due 10/28/2023 Shingles vaccine: Completed series     Advanced directives: Please bring in a copy of your advanced medical directives so that we may scan into your chart.   Conditions/risks identified: None   Next appointment: None   Preventive Care 65 Years and Older, Male Preventive care refers to lifestyle choices and visits with your health care provider that can promote health and wellness. What does preventive care include? A yearly physical exam. This is also called an annual well check. Dental exams once or twice a year. Routine eye exams. Ask your health care provider how often you should have your eyes checked. Personal lifestyle choices, including: Daily care of your teeth and gums. Regular physical activity. Eating a healthy diet. Avoiding tobacco and drug use. Limiting alcohol use. Practicing safe sex. Taking low doses of aspirin every day. Taking vitamin and mineral supplements as recommended by your health care provider. What happens during an annual well check? The services and screenings done by your health care provider during your annual well check will depend on your age, overall health, lifestyle risk factors, and family history of disease. Counseling  Your health care provider may ask you questions about your: Alcohol use. Tobacco use. Drug use. Emotional well-being. Home and  relationship well-being. Sexual activity. Eating habits. History of falls. Memory and ability to understand (cognition). Work and work Statistician. Screening  You may have the following tests or measurements: Height, weight, and BMI. Blood pressure. Lipid and cholesterol levels. These may be checked every 5 years, or more frequently if you are over 36 years old. Skin check. Lung cancer screening. You may have this screening every year starting at age 44 if you have a 30-pack-year history of smoking and currently smoke or have quit within the past 15 years. Fecal occult blood test (FOBT) of the stool. You may have this test every year starting at age 77. Flexible sigmoidoscopy or colonoscopy. You may have a sigmoidoscopy every 5 years or a colonoscopy every 10 years starting at age 71. Prostate cancer screening. Recommendations will vary depending on your family history and other risks. Hepatitis C blood test. Hepatitis B blood test. Sexually transmitted disease (STD) testing. Diabetes screening. This is done by checking your blood sugar (glucose) after you have not eaten for a while (fasting). You may have this done every 1-3 years. Abdominal aortic aneurysm (AAA) screening. You may need this if you are a current or former smoker. Osteoporosis. You may be screened starting at age 21 if you are at high risk. Talk with your health care provider about your test results, treatment options, and if necessary, the need for more tests. Vaccines  Your health care provider may recommend certain vaccines, such as: Influenza vaccine. This is recommended every year. Tetanus, diphtheria, and acellular pertussis (Tdap, Td) vaccine. You may need a Td booster every 10 years. Zoster vaccine. You may need this after age 26. Pneumococcal 13-valent conjugate (PCV13) vaccine. One dose is  recommended after age 54. Pneumococcal polysaccharide (PPSV23) vaccine. One dose is recommended after age 81. Talk to your  health care provider about which screenings and vaccines you need and how often you need them. This information is not intended to replace advice given to you by your health care provider. Make sure you discuss any questions you have with your health care provider. Document Released: 03/16/2015 Document Revised: 11/07/2015 Document Reviewed: 12/19/2014 Elsevier Interactive Patient Education  2017 [place] Prevention in the Home Falls can cause injuries. They can happen to people of all ages. There are many things you can do to make your home safe and to help prevent falls. What can I do on the outside of my home? Regularly fix the edges of walkways and driveways and fix any cracks. Remove anything that might make you trip as you walk through a door, such as a raised step or threshold. Trim any bushes or trees on the path to your home. Use bright outdoor lighting. Clear any walking paths of anything that might make someone trip, such as rocks or tools. Regularly check to see if handrails are loose or broken. Make sure that both sides of any steps have handrails. Any raised decks and porches should have guardrails on the edges. Have any leaves, snow, or ice cleared regularly. Use sand or salt on walking paths during winter. Clean up any spills in your garage right away. This includes oil or grease spills. What can I do in the bathroom? Use night lights. Install grab bars by the toilet and in the tub and shower. Do not use towel bars as grab bars. Use non-skid mats or decals in the tub or shower. If you need to sit down in the shower, use a plastic, non-slip stool. Keep the floor dry. Clean up any water that spills on the floor as soon as it happens. Remove soap buildup in the tub or shower regularly. Attach bath mats securely with double-sided non-slip rug tape. Do not have throw rugs and other things on the floor that can make you trip. What can I do in the bedroom? Use night  lights. Make sure that you have a light by your bed that is easy to reach. Do not use any sheets or blankets that are too big for your bed. They should not hang down onto the floor. Have a firm chair that has side arms. You can use this for support while you get dressed. Do not have throw rugs and other things on the floor that can make you trip. What can I do in the kitchen? Clean up any spills right away. Avoid walking on wet floors. Keep items that you use a lot in easy-to-reach places. If you need to reach something above you, use a strong step stool that has a grab bar. Keep electrical cords out of the way. Do not use floor polish or wax that makes floors slippery. If you must use wax, use non-skid floor wax. Do not have throw rugs and other things on the floor that can make you trip. What can I do with my stairs? Do not leave any items on the stairs. Make sure that there are handrails on both sides of the stairs and use them. Fix handrails that are broken or loose. Make sure that handrails are as long as the stairways. Check any carpeting to make sure that it is firmly attached to the stairs. Fix any carpet that is loose or worn. Avoid having  throw rugs at the top or bottom of the stairs. If you do have throw rugs, attach them to the floor with carpet tape. Make sure that you have a light switch at the top of the stairs and the bottom of the stairs. If you do not have them, ask someone to add them for you. What else can I do to help prevent falls? Wear shoes that: Do not have high heels. Have rubber bottoms. Are comfortable and fit you well. Are closed at the toe. Do not wear sandals. If you use a stepladder: Make sure that it is fully opened. Do not climb a closed stepladder. Make sure that both sides of the stepladder are locked into place. Ask someone to hold it for you, if possible. Clearly mark and make sure that you can see: Any grab bars or handrails. First and last  steps. Where the edge of each step is. Use tools that help you move around (mobility aids) if they are needed. These include: Canes. Walkers. Scooters. Crutches. Turn on the lights when you go into a dark area. Replace any light bulbs as soon as they burn out. Set up your furniture so you have a clear path. Avoid moving your furniture around. If any of your floors are uneven, fix them. If there are any pets around you, be aware of where they are. Review your medicines with your doctor. Some medicines can make you feel dizzy. This can increase your chance of falling. Ask your doctor what other things that you can do to help prevent falls. This information is not intended to replace advice given to you by your health care provider. Make sure you discuss any questions you have with your health care provider. Document Released: 12/14/2008 Document Revised: 07/26/2015 Document Reviewed: 03/24/2014 Elsevier Interactive Patient Education  2017 [employer].

## 2020-08-27 NOTE — Progress Notes (Addendum)
Subjective:   DEVEION DENZ is a 85 y.o. male who presents for an Initial Medicare Annual Wellness Visit.  I connected with Gwenith Spitz  today by telephone and verified that I am speaking with the correct person using two identifiers. Location patient: home Location provider: work Persons participating in the virtual visit: patient, provider.   I discussed the limitations, risks, security and privacy concerns of performing an evaluation and management service by telephone and the availability of in person appointments. I also discussed with the patient that there may be a patient responsible charge related to this service. The patient expressed understanding and verbally consented to this telephonic visit.    Interactive audio and video telecommunications were attempted between this provider and patient, however failed, due to patient having technical difficulties OR patient did not have access to video capability.  We continued and completed visit with audio only.     Review of Systems    N/A  Cardiac Risk Factors include: advanced age (>60men, >43 women);male gender;hypertension;dyslipidemia     Objective:    Today's Vitals   08/27/20 1429  BP: 132/71  Pulse: 76  Temp: 98.2 F (36.8 C)  TempSrc: Oral  SpO2: 96%  Weight: 179 lb 4 oz (81.3 kg)  Height: 5\' 8"  (1.727 m)   Body mass index is 27.25 kg/m.  Advanced Directives 08/27/2020 03/16/2017 02/04/2017 03/13/2015  Does Patient Have a Medical Advance Directive? No No No No  Would patient like information on creating a medical advance directive? No - Patient declined No - Patient declined - No - patient declined information    Current Medications (verified) Outpatient Encounter Medications as of 08/27/2020  Medication Sig   acetaminophen (TYLENOL) 500 MG tablet Take 1,000 mg by mouth daily.   atorvastatin (LIPITOR) 20 MG tablet Take 0.5 tablets (10 mg total) by mouth daily.   cetirizine (ZYRTEC) 10 MG tablet Take 10 mg  by mouth daily.   Facility-Administered Encounter Medications as of 08/27/2020  Medication   Benralizumab SOSY 30 mg    Allergies (verified) Ceftin [cefuroxime axetil]   History: Past Medical History:  Diagnosis Date   Allergy    Angio-edema    Asbestos exposure    Cataract    Chronic cough    Dyspnea    chronic   Exposure to other animate mechanical forces, initial encounter    Heart murmur    History of kidney stones    Hyperlipidemia    Hyperlipidemia, unspecified    Localized swelling, mass and lump, right lower limb    Moderate persistent asthma without complication 7/41/2878   OSA (obstructive sleep apnea) 03/10/2019   Recurrent upper respiratory infection (URI)    Swelling of throat 02/24/2019   Unspecified chronic bronchitis ([place])    Past Surgical History:  Procedure Laterality Date   CATARACT EXTRACTION W/PHACO Right 03/19/2015   Procedure: CATARACT EXTRACTION PHACO AND INTRAOCULAR LENS PLACEMENT; CDE:  9.70;  Surgeon: Williams Che, MD;  Location: [facility];  Service: Ophthalmology;  Laterality: Right;   CATARACT EXTRACTION W/PHACO Left 03/16/2017   Procedure: CATARACT EXTRACTION PHACO AND INTRAOCULAR LENS PLACEMENT (IOC);  Surgeon: Tonny Branch, MD;  Location: [facility];  Service: Ophthalmology;  Laterality: Left;  CDE: 26.41   EYE SURGERY     Family History  Problem Relation Age of Onset   Cancer Mother    COPD Father    Allergic rhinitis Neg Hx    Angioedema Neg Hx    Asthma Neg Hx  Atopy Neg Hx    Eczema Neg Hx    Immunodeficiency Neg Hx    Urticaria Neg Hx    Social History   Socioeconomic History   Marital status: Widowed    Spouse name: Not on file   Number of children: 1   Years of education: Not on file   Highest education level: Not on file  Occupational History    Comment: Retired, Education officer, museum   Tobacco Use   Smoking status: Former    Packs/day: 2.00    Years: 30.00    Pack years: 60.00    Types: Cigarettes    Quit date: 03/12/1978     Years since quitting: 42.4   Smokeless tobacco: Never  Vaping Use   Vaping Use: Never used  Substance and Sexual Activity   Alcohol use: No   Drug use: No   Sexual activity: Not Currently    Birth control/protection: None  Other Topics Concern   Not on file  Social History Narrative   Lives alone    Dog: Celenia- [place]ie       Son lives close       Enjoys: helping people with things, gym work       Diet: eats all food groups    Caffeine: decaf    Water: 6-8 cups a day       Wears seat belt   Does not use phone while driving    Smoke Midwife in home, safe area    Social Determinants of Health   Financial Resource Strain: Low Risk    Difficulty of Paying Living Expenses: Not hard at all  Food Insecurity: No Food Insecurity   Worried About Charity fundraiser in the Last Year: Never true   Arboriculturist in the Last Year: Never true  Transportation Needs: No Transportation Needs   Lack of Transportation (Medical): No   Lack of Transportation (Non-Medical): No  Physical Activity: Sufficiently Active   Days of Exercise per Week: 4 days   Minutes of Exercise per Session: 120 min  Stress: No Stress Concern Present   Feeling of Stress : Not at all  Social Connections: Moderately Isolated   Frequency of Communication with Friends and Family: Three times a week   Frequency of Social Gatherings with Friends and Family: More than three times a week   Attends Religious Services: More than 4 times per year   Active Member of Clubs or Organizations: No   Attends Archivist Meetings: Never   Marital Status: Widowed    Tobacco Counseling Counseling given: Not Answered   Clinical Intake:  Pre-visit preparation completed: Yes  Pain : No/denies pain     Nutritional Risks: None Diabetes: No  How often do you need to have someone help you when you read instructions, pamphlets, or other written materials from your doctor or  pharmacy?: 1 - Never  Diabetic?No   Interpreter Needed?: No  Information entered by :: [place] of Daily Living In your present state of health, do you have any difficulty performing the following activities: 08/27/2020 10/04/2019  Hearing? Y N  Comment wears bilateral hearing aids -  Vision? N N  Difficulty concentrating or making decisions? N N  Walking or climbing stairs? N N  Dressing or bathing? N N  Doing errands, shopping? N N  Preparing Food and eating ? N -  Using the Toilet? N -  In the past six months, have you accidently leaked urine? N -  Do you have problems with loss of bowel control? N -  Managing your Medications? N -  Managing your Finances? N -  Housekeeping or managing your Housekeeping? N -  Some recent data might be hidden    Patient Care Team: Perlie Mayo, NP as PCP - General (Family Medicine)  Indicate any recent Medical Services you may have received from other than [facility] providers in the past year (date may be approximate).     Assessment:   This is a routine wellness examination for Venora Maples.  Hearing/Vision screen Vision Screening - Comments:: Patient states gets eyes examined once per year. Currently wears glasses   Dietary issues and exercise activities discussed: Current Exercise Habits: Home exercise routine, Type of exercise: walking;strength training/weights;treadmill, Time (Minutes): > 60, Frequency (Times/Week): 4, Weekly Exercise (Minutes/Week): 0, Intensity: Moderate, Exercise limited by: None identified   Goals Addressed             This Visit's Progress    Prevent falls         Depression Screen PHQ 2/9 Scores 08/27/2020 08/27/2020 04/10/2020 10/04/2019 06/29/2019 05/18/2019 05/03/2019  PHQ - 2 Score 0 0 0 0 0 0 0  Exception Documentation - - - - - - -    Fall Risk Fall Risk  08/27/2020 04/10/2020 10/04/2019 06/29/2019 05/18/2019  Falls in the past year? 0 0 0 0 0  Number falls in past yr: 0 0 0 0 0  Injury with Fall? 0  0 0 0 0  Risk for fall due to : No Fall Risks No Fall Risks No Fall Risks - -  Follow up Falls evaluation completed;Falls prevention discussed Falls evaluation completed Falls evaluation completed Falls evaluation completed Falls evaluation completed    FALL RISK PREVENTION PERTAINING TO THE HOME:  Any stairs in or around the home? No  If so, are there any without handrails? No  Home free of loose throw rugs in walkways, pet beds, electrical cords, etc? Yes  Adequate lighting in your home to reduce risk of falls? Yes   ASSISTIVE DEVICES UTILIZED TO PREVENT FALLS:  Life alert? No  Use of a cane, walker or w/c? No  Grab bars in the bathroom? No  Shower chair or bench in shower? No  Elevated toilet seat or a handicapped toilet? No    Cognitive Function:  Normal cognitive status assessed by direct observation by this Nurse Health Advisor. No abnormalities found.        Immunizations Immunization History  Administered Date(s) Administered   Fluad Quad(high Dose 65+) 01/10/2020   Influenza Split 03/21/2016, 12/25/2017, 11/09/2018   Influenza, High Dose Seasonal PF 03/03/2016, 03/05/2017   Influenza-Unspecified 02/14/2011, 01/13/2012, 01/18/2013, 03/15/2015, 11/02/2018   PFIZER(Purple Top)SARS-COV-2 Vaccination 01/12/2019, 04/16/2019, 04/20/2019, 05/11/2019, 01/05/2020   Pneumococcal Conjugate-13 10/27/2013, 12/02/2015   Pneumococcal Polysaccharide-23 08/12/2017   Tdap 10/27/2013, 12/01/2013   Zoster Recombinat (Shingrix) 10/10/2019, 01/10/2020    TDAP status: Up to date  Flu Vaccine status: Up to date  Pneumococcal vaccine status: Up to date  Covid-19 vaccine status: Completed vaccines  Qualifies for Shingles Vaccine? Yes   Zostavax completed No   Shingrix Completed?: Yes  Screening Tests Health Maintenance  Topic Date Due   INFLUENZA VACCINE  10/01/2020   TETANUS/TDAP  12/02/2023   COVID-19 Vaccine  Completed   PNA vac Low Risk Adult  Completed   Zoster  Vaccines- Shingrix  Completed   HPV VACCINES  Aged Out    Health Maintenance  There are no preventive care reminders to display for this patient.  Colorectal cancer screening: No longer required.   Lung Cancer Screening: (Low Dose CT Chest recommended if Age 43-80 years, 30 pack-year currently smoking OR have quit w/in 15years.) does not qualify.   Lung Cancer Screening Referral: N/A   Additional Screening:  Hepatitis C Screening: does not qualify;  Vision Screening: Recommended annual ophthalmology exams for early detection of glaucoma and other disorders of the eye. Is the patient up to date with their annual eye exam?  Yes  Who is the provider or what is the name of the office in which the patient attends annual eye exams? Dr. Jorja Loa If pt is not established with a provider, would they like to be referred to a provider to establish care? No .   Dental Screening: Recommended annual dental exams for proper oral hygiene  Community Resource Referral / Chronic Care Management: CRR required this visit?  No   CCM required this visit?  No      Plan:     I have personally reviewed and noted the following in the patient's chart:   Medical and social history Use of alcohol, tobacco or illicit drugs  Current medications and supplements including opioid prescriptions. Patient is not currently taking opioid prescriptions. Functional ability and status Nutritional status Physical activity Advanced directives List of other physicians Hospitalizations, surgeries, and ER visits in previous 12 months Vitals Screenings to include cognitive, depression, and falls Referrals and appointments  In addition, I have reviewed and discussed with patient certain preventive protocols, quality metrics, and best practice recommendations. A written personalized care plan for preventive services as well as general preventive health recommendations were provided to patient.     Ofilia Neas,  LPN   [id number]   Nurse Notes: None

## 2020-09-08 IMAGING — DX DG CHEST 2V
2 series · 2 of 2 positions shown · non-contrast
Comparison: 03/10/2019

CLINICAL DATA: Lower extremity edema, intermittent shortness of
breath, history of asbestos exposure

EXAM:
CHEST - 2 VIEW

[chest pa]
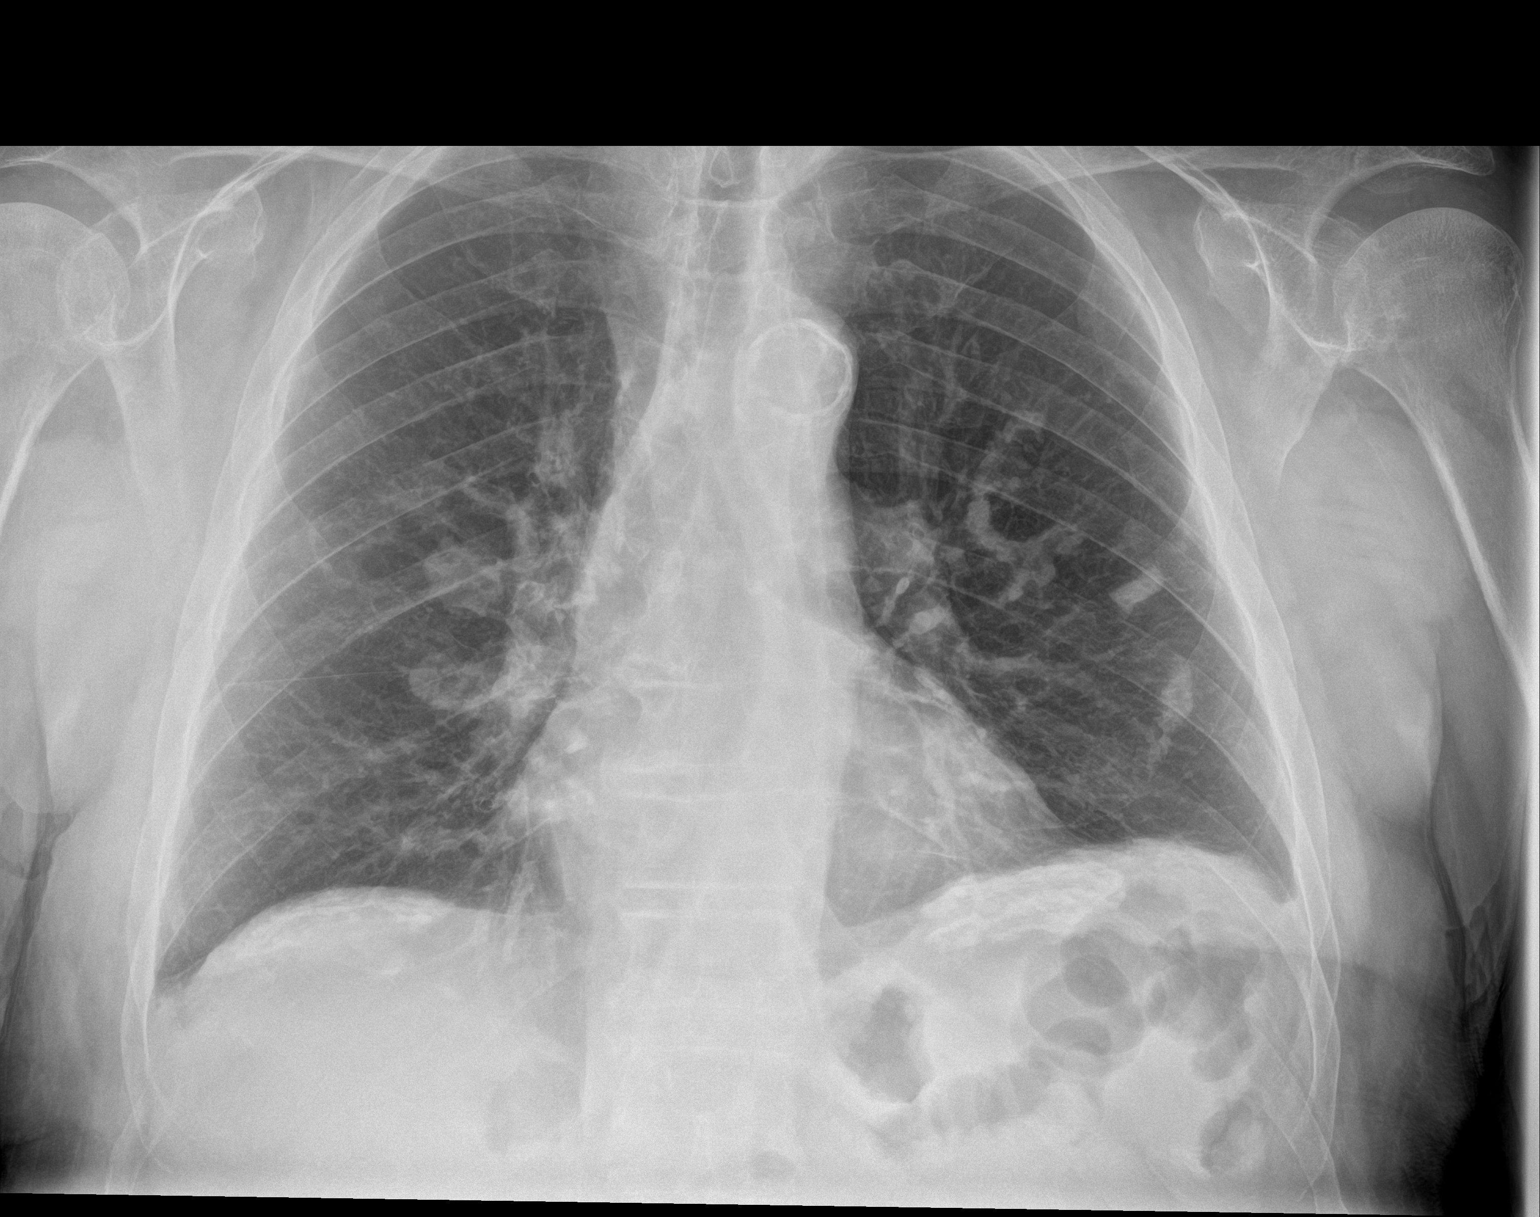

[chest lat]
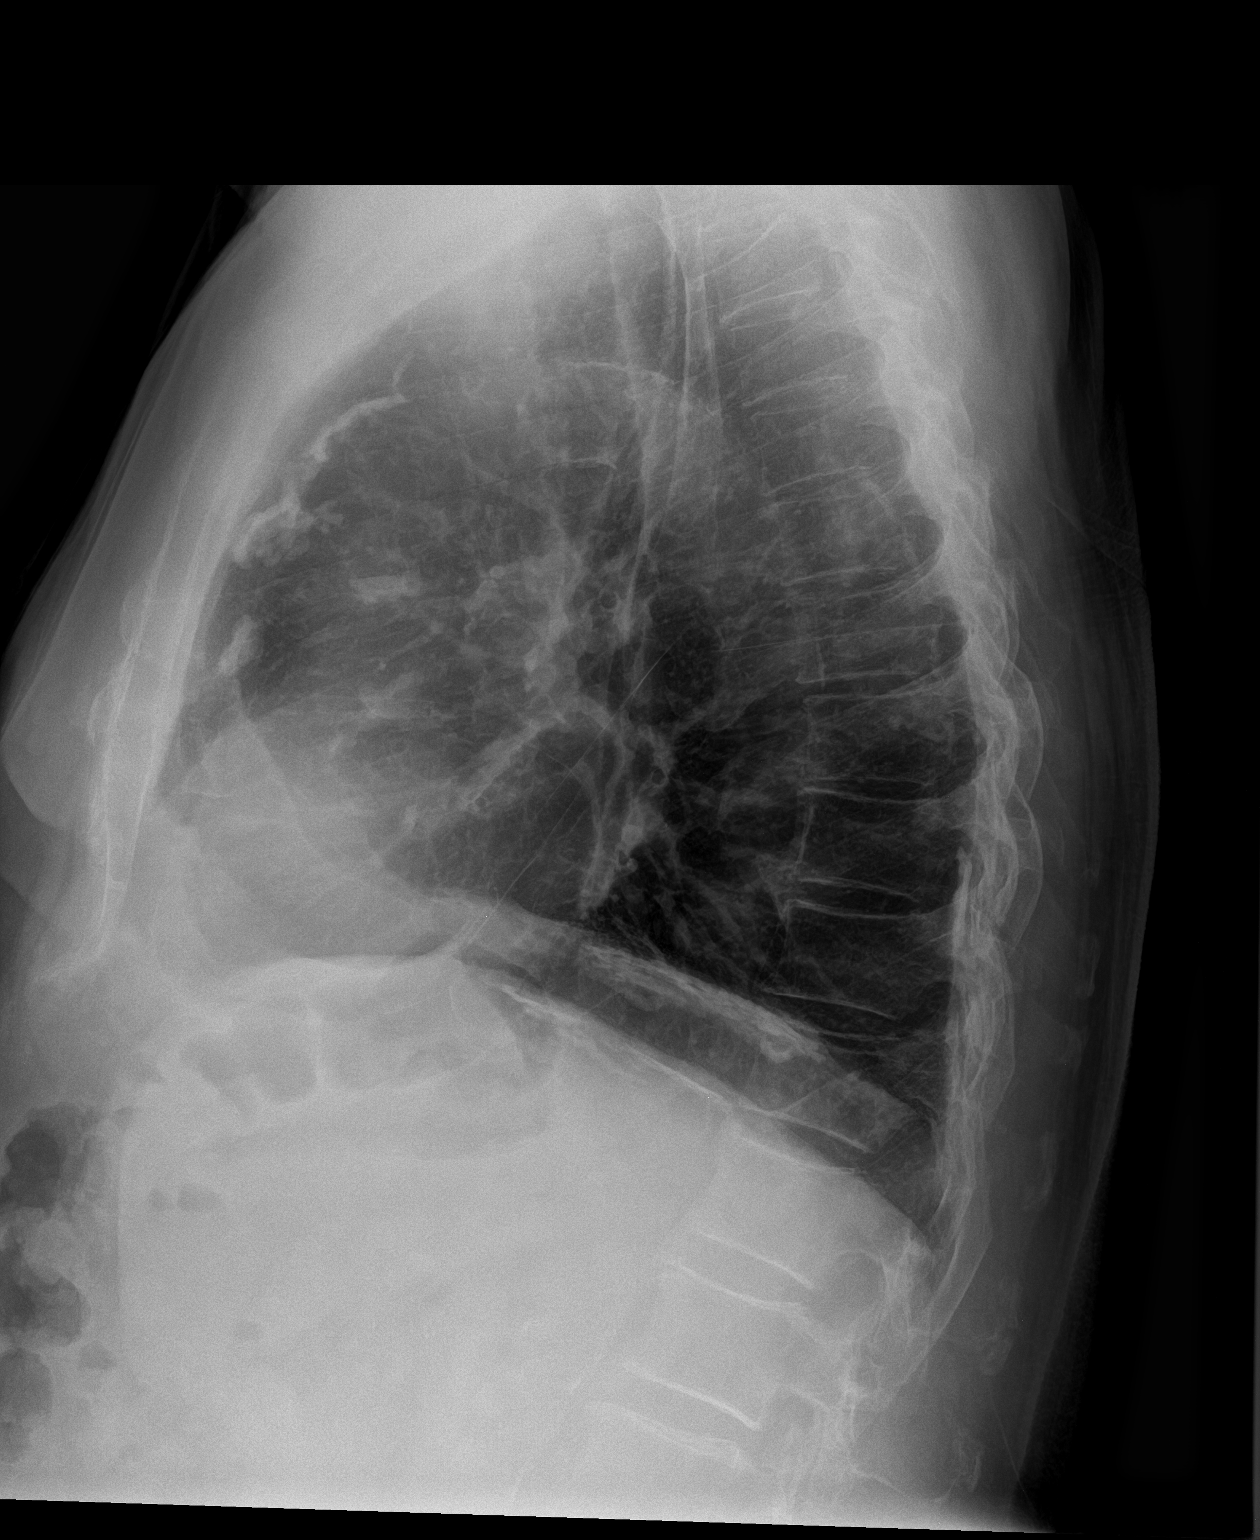

[2 of 2 positions shown; findings below may reference images not displayed]

FINDINGS: The heart size and mediastinal contours are within normal limits.
Both lungs are clear. The visualized skeletal structures are
unremarkable.

Stable bilateral calcified pleural plaques consistent with given
history of prior asbestos exposure.
IMPRESSION: 1. Stable calcified pleural plaques bilaterally.
2. No acute intrathoracic process.

## 2020-09-08 IMAGING — US US EXTREM LOW VENOUS
1 series · 13 of 24 positions shown · non-contrast
Comparison: 07/05/2015

CLINICAL DATA: Bilateral lower extremity edema



[Series 1: us extrem low venous · 0.08mm/px · 13 of 87 slices shown]
[im 1/87]
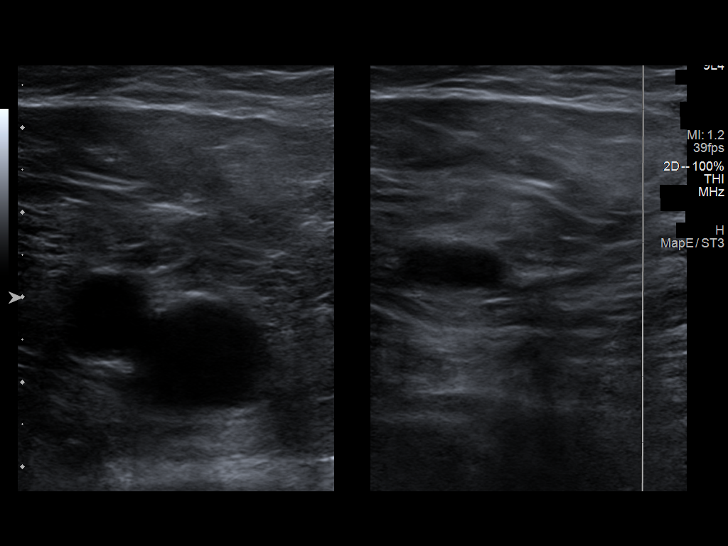
[im 8/87]
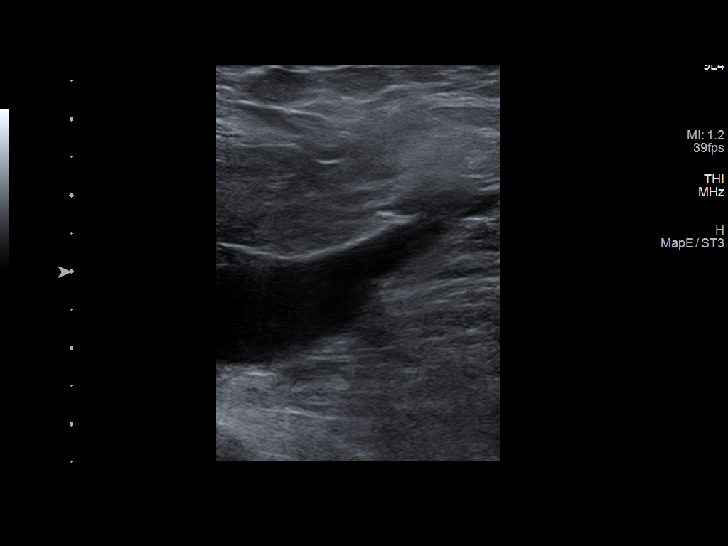
[im 15/87]
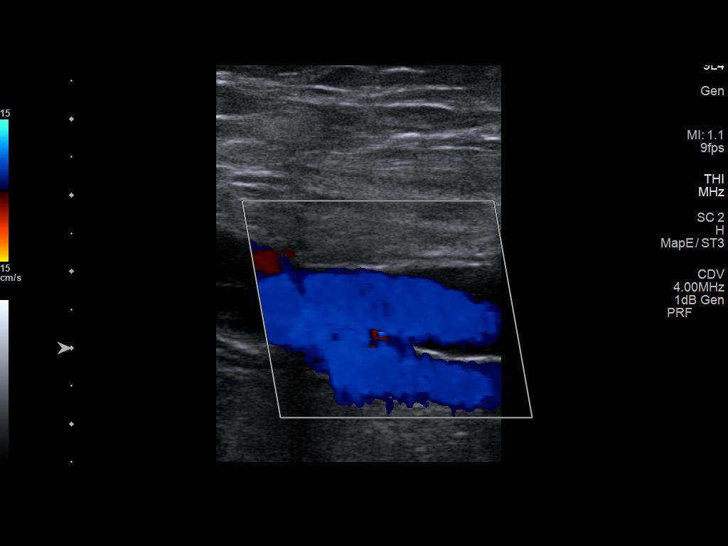
[im 23/87]
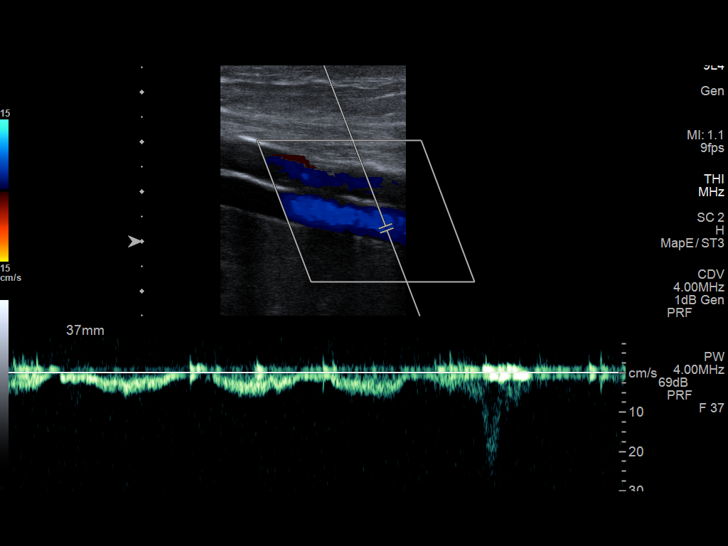
[im 30/87]
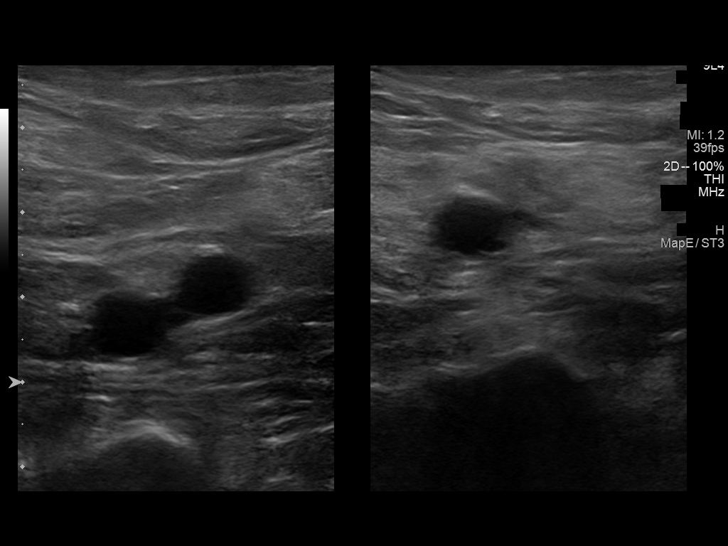
[im 38/87]
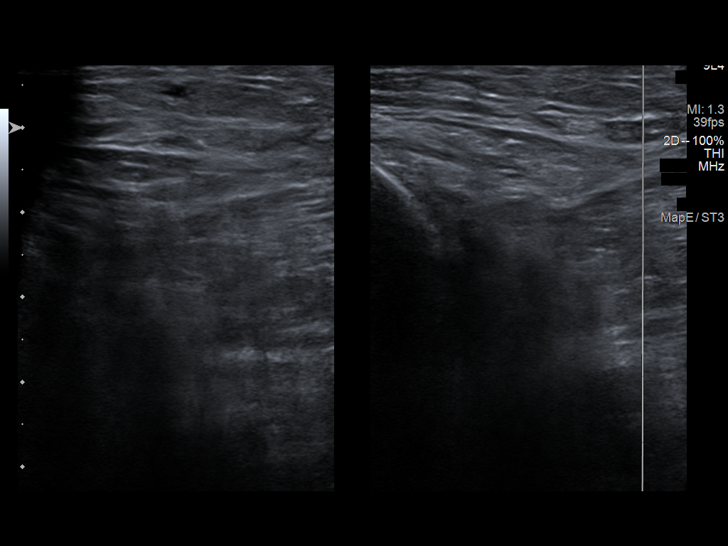
[im 45/87]
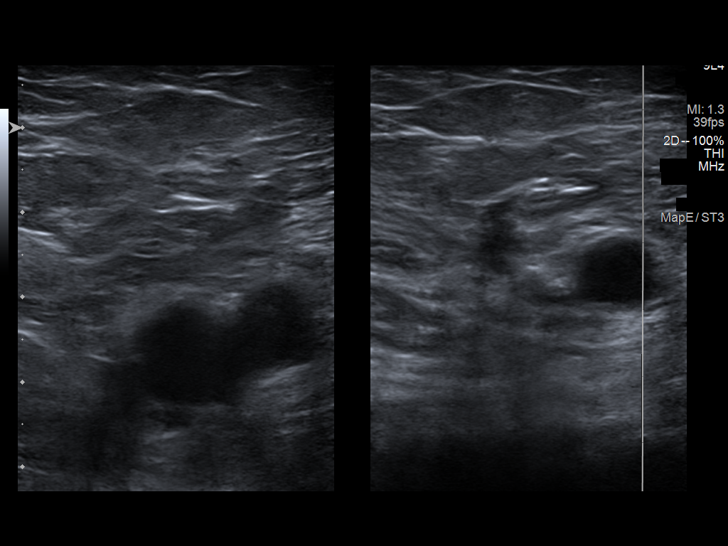
[im 49/87]
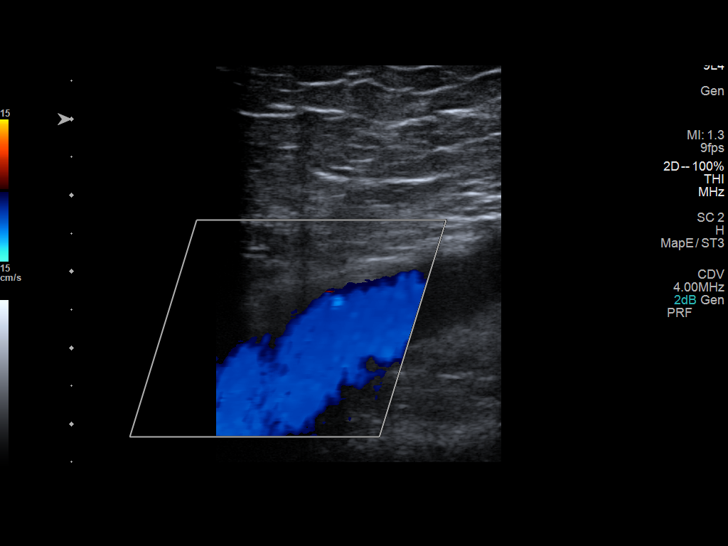
[im 57/87]
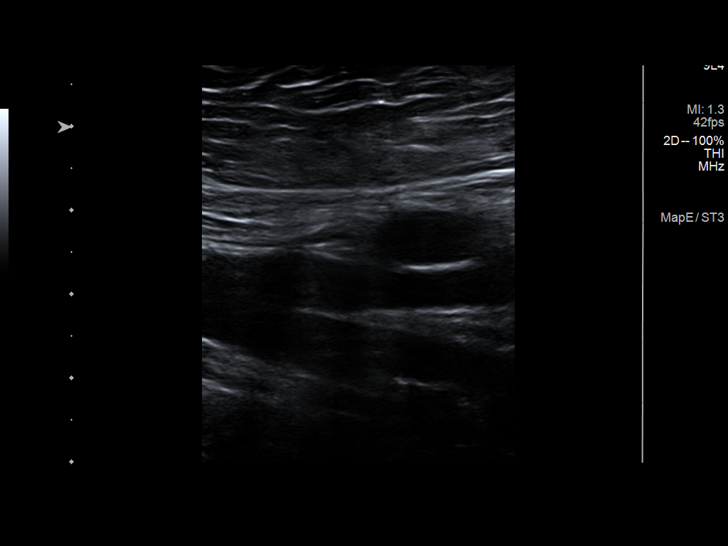
[im 64/87]
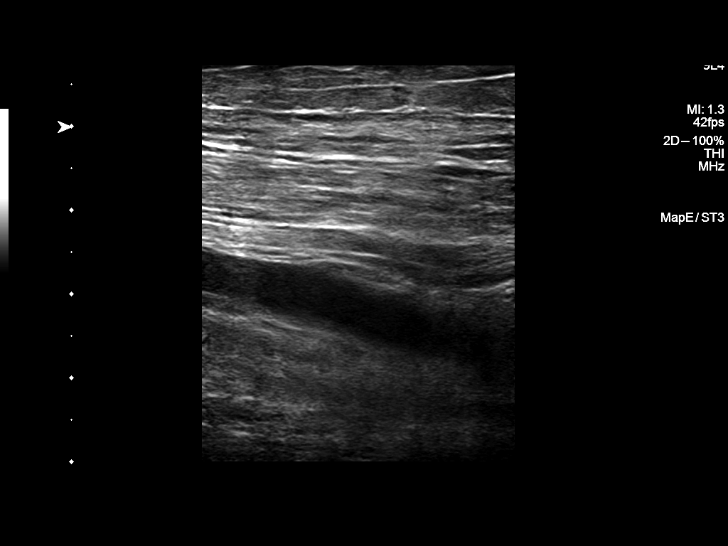
[im 72/87]
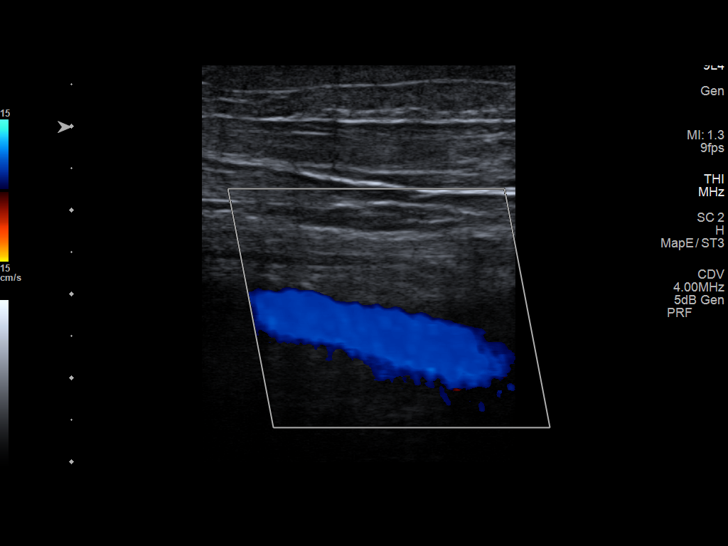
[im 79/87]
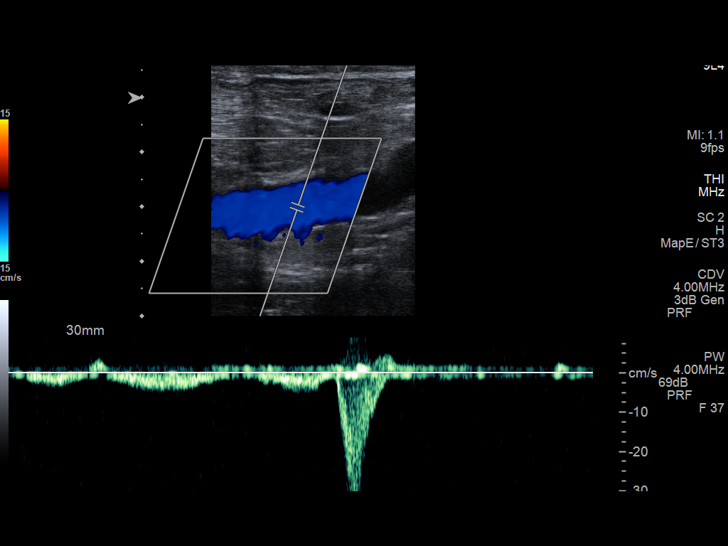
[im 87/87]
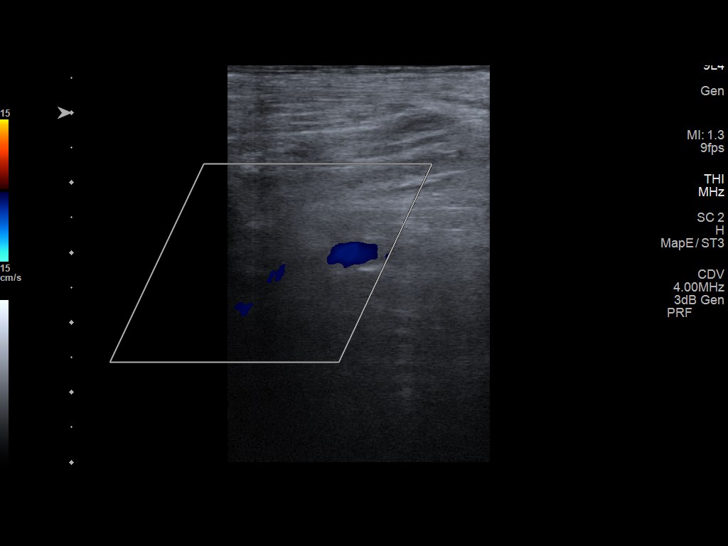

[13 of 24 positions shown; findings below may reference images not displayed]

FINDINGS: RIGHT LOWER EXTREMITY

Common Femoral Vein: No evidence of thrombus. Normal
compressibility, respiratory phasicity and response to augmentation.

Saphenofemoral Junction: No evidence of thrombus. Normal
compressibility and flow on color Doppler imaging.

Profunda Femoral Vein: No evidence of thrombus. Normal
compressibility and flow on color Doppler imaging.

Femoral Vein: No evidence of thrombus. Normal compressibility,
respiratory phasicity and response to augmentation.

Popliteal Vein: No evidence of thrombus. Normal compressibility,
respiratory phasicity and response to augmentation.

Calf Veins: No evidence of thrombus. Normal compressibility and flow
on color Doppler imaging.

Superficial Great Saphenous Vein: No evidence of thrombus. Normal
compressibility.

Venous Reflux:  None.

Other Findings:  None.

LEFT LOWER EXTREMITY

Common Femoral Vein: No evidence of thrombus. Normal
compressibility, respiratory phasicity and response to augmentation.

Saphenofemoral Junction: No evidence of thrombus. Normal
compressibility and flow on color Doppler imaging.

Profunda Femoral Vein: No evidence of thrombus. Normal
compressibility and flow on color Doppler imaging.

Femoral Vein: No evidence of thrombus. Normal compressibility,
respiratory phasicity and response to augmentation.

Popliteal Vein: No evidence of thrombus. Normal compressibility,
respiratory phasicity and response to augmentation.

Calf Veins: No evidence of thrombus. Normal compressibility and flow
on color Doppler imaging.

Superficial Great Saphenous Vein: No evidence of thrombus. Normal
compressibility.

Venous Reflux:  None.

Other Findings:  None.
IMPRESSION: No evidence of deep venous thrombosis in either lower extremity.

## 2020-09-21 ENCOUNTER — Ambulatory Visit (INDEPENDENT_AMBULATORY_CARE_PROVIDER_SITE_OTHER): Payer: Medicare Other

## 2020-09-21 ENCOUNTER — Other Ambulatory Visit: Payer: Self-pay

## 2020-09-21 DIAGNOSIS — J4540 Moderate persistent asthma, uncomplicated: Secondary | ICD-10-CM

## 2020-09-21 DIAGNOSIS — J4550 Severe persistent asthma, uncomplicated: Secondary | ICD-10-CM

## 2020-10-16 ENCOUNTER — Other Ambulatory Visit: Payer: Self-pay

## 2020-10-16 DIAGNOSIS — E785 Hyperlipidemia, unspecified: Secondary | ICD-10-CM

## 2020-10-16 MED ORDER — ATORVASTATIN CALCIUM 20 MG PO TABS: 10.0000 mg | ORAL_TABLET | Freq: Every day | ORAL | 1 refills | Status: DC

## 2020-10-22 ENCOUNTER — Encounter: Payer: Self-pay | Admitting: Nurse Practitioner

## 2020-10-22 ENCOUNTER — Telehealth (INDEPENDENT_AMBULATORY_CARE_PROVIDER_SITE_OTHER): Payer: Medicare Other | Admitting: Nurse Practitioner

## 2020-10-22 ENCOUNTER — Other Ambulatory Visit: Payer: Self-pay

## 2020-10-22 DIAGNOSIS — U071 COVID-19: Secondary | ICD-10-CM | POA: Diagnosis not present

## 2020-10-22 MED ORDER — NIRMATRELVIR/RITONAVIR (PAXLOVID) TABLET (RENAL DOSING): 2.0000 | ORAL_TABLET | Freq: Two times a day (BID) | ORAL | 0 refills | Status: AC

## 2020-10-22 MED ORDER — BENZONATATE 100 MG PO CAPS: 100.0000 mg | ORAL_CAPSULE | Freq: Two times a day (BID) | ORAL | 0 refills | Status: DC | PRN

## 2020-10-22 MED ORDER — CORICIDIN HBP COUGH/COLD 4-30 MG PO TABS: 1.0000 | ORAL_TABLET | Freq: Four times a day (QID) | ORAL | 0 refills | Status: DC | PRN

## 2020-10-22 NOTE — Assessment & Plan Note (Signed)
-  had positive home test -symptoms started 5 days ago -Rx. Paxlovid, coricidin, and tessalon

## 2020-10-22 NOTE — Progress Notes (Signed)
Acute Office Visit  Subjective:    Patient ID: Ryan Hanson, male    DOB: 1935-03-15, 85 y.o.   MRN: [id number]  Chief Complaint  Patient presents with   Covid Positive    Tested positive on an at home test Friday, feels better than he has. Has some chest congestion and productive cough.     HPI Patient is in today for COVID-19. His symptoms started 5 days ago. Symptoms listed below. He states he has taken airborne since his symptoms started.  Past Medical History:  Diagnosis Date   Allergy    Angio-edema    Asbestos exposure    Cataract    Chronic cough    Dyspnea    chronic   Exposure to other animate mechanical forces, initial encounter    Heart murmur    History of kidney stones    Hyperlipidemia    Hyperlipidemia, unspecified    Localized swelling, mass and lump, right lower limb    Moderate persistent asthma without complication [id number]   OSA (obstructive sleep apnea) 03/10/2019   Recurrent upper respiratory infection (URI)    Swelling of throat 02/24/2019   Unspecified chronic bronchitis ([place])     Past Surgical History:  Procedure Laterality Date   CATARACT EXTRACTION W/PHACO Right 03/19/2015   Procedure: CATARACT EXTRACTION PHACO AND INTRAOCULAR LENS PLACEMENT; CDE:  9.70;  Surgeon: Williams Che, MD;  Location: [facility];  Service: Ophthalmology;  Laterality: Right;   CATARACT EXTRACTION W/PHACO Left 03/16/2017   Procedure: CATARACT EXTRACTION PHACO AND INTRAOCULAR LENS PLACEMENT (IOC);  Surgeon: Tonny Branch, MD;  Location: [facility];  Service: Ophthalmology;  Laterality: Left;  CDE: 26.41   EYE SURGERY      Family History  Problem Relation Age of Onset   Cancer Mother    COPD Father    Allergic rhinitis Neg Hx    Angioedema Neg Hx    Asthma Neg Hx    Atopy Neg Hx    Eczema Neg Hx    Immunodeficiency Neg Hx    Urticaria Neg Hx     Social History   Socioeconomic History   Marital status: Widowed    Spouse name: Not on file   Number of children: 1    Years of education: Not on file   Highest education level: Not on file  Occupational History    Comment: Retired, Education officer, museum   Tobacco Use   Smoking status: Former    Packs/day: 2.00    Years: 30.00    Pack years: 60.00    Types: Cigarettes    Quit date: 03/12/1978    Years since quitting: 42.6   Smokeless tobacco: Never  Vaping Use   Vaping Use: Never used  Substance and Sexual Activity   Alcohol use: No   Drug use: No   Sexual activity: Not Currently    Birth control/protection: None  Other Topics Concern   Not on file  Social History Narrative   Lives alone    Dog: Celenia- [place]       Son lives close       Enjoys: helping people with things, gym work       Diet: eats all food groups    Caffeine: decaf    Water: 6-8 cups a day       Wears seat belt   Does not use phone while driving    Presenter, 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, safe area  Social Determinants of Health   Financial Resource Strain: Low Risk    Difficulty of Paying Living Expenses: Not hard at all  Food Insecurity: No Food Insecurity   Worried About Charity fundraiser in the Last Year: Never true   [place] in the Last Year: Never true  Transportation Needs: No Transportation Needs   Lack of Transportation (Medical): No   Lack of Transportation (Non-Medical): No  Physical Activity: Sufficiently Active   Days of Exercise per Week: 4 days   Minutes of Exercise per Session: 120 min  Stress: No Stress Concern Present   Feeling of Stress : Not at all  Social Connections: Moderately Isolated   Frequency of Communication with Friends and Family: Three times a week   Frequency of Social Gatherings with Friends and Family: More than three times a week   Attends Religious Services: More than 4 times per year   Active Member of Clubs or Organizations: No   Attends Archivist Meetings: Never   Marital Status: Widowed  Human resources officer Violence: Not At Risk    Fear of Current or Ex-Partner: No   Emotionally Abused: No   Physically Abused: No   Sexually Abused: No    Outpatient Medications Prior to Visit  Medication Sig Dispense Refill   acetaminophen (TYLENOL) 500 MG tablet Take 1,000 mg by mouth daily.     atorvastatin (LIPITOR) 20 MG tablet Take 0.5 tablets (10 mg total) by mouth daily. 90 tablet 1   cetirizine (ZYRTEC) 10 MG tablet Take 10 mg by mouth daily.     Facility-Administered Medications Prior to Visit  Medication Dose Route Frequency Provider Last Rate Last Admin   Benralizumab SOSY 30 mg  30 mg Subcutaneous Q8 Toney Reil, MD   30 mg at 09/21/20 [id number]    Allergies  Allergen Reactions   Ceftin [Cefuroxime Axetil]     Review of Systems  Constitutional:  Negative for chills, fatigue and fever.  HENT:  Positive for congestion and sore throat. Negative for sinus pressure and sinus pain.   Respiratory:  Positive for cough. Negative for shortness of breath and wheezing.       Objective:    Physical Exam  There were no vitals taken for this visit. Wt Readings from Last 3 Encounters:  08/27/20 179 lb 4 oz (81.3 kg)  04/10/20 179 lb (81.2 kg)  10/04/19 179 lb 12.8 oz (81.6 kg)    Health Maintenance Due  Topic Date Due   INFLUENZA VACCINE  10/01/2020    There are no preventive care reminders to display for this patient.   Lab Results  Component Value Date   TSH 1.99 11/10/2018   Lab Results  Component Value Date   WBC 7.4 05/02/2019   HGB 14.6 05/02/2019   HCT 43.3 05/02/2019   MCV 92.9 05/02/2019   PLT 237 05/02/2019   Lab Results  Component Value Date   NA 137 06/27/2019   K 4.1 06/27/2019   CO2 31 06/27/2019   GLUCOSE 106 (H) 06/27/2019   BUN 19 06/27/2019   CREATININE 1.28 (H) 06/27/2019   BILITOT 0.7 05/02/2019   ALKPHOS 123 (H) 04/06/2019   AST 18 05/02/2019   ALT 19 05/02/2019   PROT 5.9 (L) 05/02/2019   ALBUMIN 4.0 04/06/2019   CALCIUM 9.3 06/27/2019   ANIONGAP 8 03/13/2015    Lab Results  Component Value Date   CHOL 174 05/02/2019   Lab Results  Component Value Date  HDL 51 05/02/2019   Lab Results  Component Value Date   LDLCALC 99 05/02/2019   Lab Results  Component Value Date   TRIG 142 05/02/2019   Lab Results  Component Value Date   CHOLHDL 3.4 05/02/2019   Lab Results  Component Value Date   HGBA1C 5.5 05/02/2019       Assessment & Plan:   Problem List Items Addressed This Visit       Other   COVID-19 - Primary    -had positive home test -symptoms started 5 days ago -Rx. Paxlovid, coricidin, and tessalon      Relevant Medications   nirmatrelvir/ritonavir EUA, renal dosing, (PAXLOVID) 10 x 150 MG & 10 x '100MG'$  TABS   benzonatate (TESSALON) 100 MG capsule   Chlorpheniramine-DM (CORICIDIN COUGH/COLD) 4-30 MG TABS     Meds ordered this encounter  Medications   nirmatrelvir/ritonavir EUA, renal dosing, (PAXLOVID) 10 x 150 MG & 10 x '100MG'$  TABS    Sig: Take 2 tablets by mouth 2 (two) times daily for 5 days. (Take nirmatrelvir 150 mg one tablet twice daily for 5 days and ritonavir 100 mg one tablet twice daily for 5 days) Patient GFR is 58    Dispense:  20 tablet    Refill:  0   benzonatate (TESSALON) 100 MG capsule    Sig: Take 1 capsule (100 mg total) by mouth 2 (two) times daily as needed for cough.    Dispense:  20 capsule    Refill:  0   Chlorpheniramine-DM (CORICIDIN COUGH/COLD) 4-30 MG TABS    Sig: Take 1 tablet by mouth every 6 (six) hours as needed.    Dispense:  28 tablet    Refill:  0   Date:  10/22/2020   Location of Patient: Home Location of Provider: Office Consent was obtain for visit to be over via telehealth. I verified that I am speaking with the correct person using two identifiers.  I connected with  [name] on 10/22/20 via telephone and verified that I am speaking with the correct person using two identifiers.   I discussed the limitations of evaluation and management by telemedicine. The  patient expressed understanding and agreed to proceed.  Time spent: 8 minutes    Noreene Larsson, NP

## 2020-11-16 ENCOUNTER — Ambulatory Visit (INDEPENDENT_AMBULATORY_CARE_PROVIDER_SITE_OTHER): Payer: Medicare Other | Admitting: *Deleted

## 2020-11-16 ENCOUNTER — Other Ambulatory Visit: Payer: Self-pay

## 2020-11-16 DIAGNOSIS — J4550 Severe persistent asthma, uncomplicated: Secondary | ICD-10-CM

## 2020-11-16 DIAGNOSIS — J4540 Moderate persistent asthma, uncomplicated: Secondary | ICD-10-CM

## 2021-01-18 ENCOUNTER — Ambulatory Visit (INDEPENDENT_AMBULATORY_CARE_PROVIDER_SITE_OTHER): Payer: Medicare Other

## 2021-01-18 ENCOUNTER — Other Ambulatory Visit: Payer: Self-pay

## 2021-01-18 DIAGNOSIS — J4550 Severe persistent asthma, uncomplicated: Secondary | ICD-10-CM | POA: Diagnosis not present

## 2021-03-01 ENCOUNTER — Ambulatory Visit (INDEPENDENT_AMBULATORY_CARE_PROVIDER_SITE_OTHER): Payer: Medicare Other

## 2021-03-01 ENCOUNTER — Other Ambulatory Visit: Payer: Self-pay

## 2021-03-01 ENCOUNTER — Ambulatory Visit
Admission: EM | Admit: 2021-03-01 | Discharge: 2021-03-01 | Disposition: A | Discharge status: Home / Self Care | Payer: Medicare Other

## 2021-03-01 DIAGNOSIS — R0782 Intercostal pain: Secondary | ICD-10-CM

## 2021-03-01 DIAGNOSIS — S20212A Contusion of left front wall of thorax, initial encounter: Secondary | ICD-10-CM | POA: Diagnosis not present

## 2021-03-01 DIAGNOSIS — R0781 Pleurodynia: Secondary | ICD-10-CM | POA: Diagnosis not present

## 2021-03-01 MED ORDER — LIDOCAINE 5 % EX PTCH: 1.0000 | MEDICATED_PATCH | CUTANEOUS | 0 refills | Status: DC

## 2021-03-01 NOTE — ED Provider Notes (Signed)
[facility]    CSN: [id number] Arrival date & time: 03/01/21  6962      History   Chief Complaint Chief Complaint  Patient presents with   Rib Injury    Left rib pain    HPI Ryan Hanson is a 85 y.o. male.   Patient presenting today with 3 to 4-day history of localized left lateral rib soreness.  States he was working under some cabinets and this area was pressed against the base of the cabinet while he was working in her immediately after this action.  He denies bruising, swelling, difficulty breathing, chest pain, abdominal pain, nausea vomiting or diarrhea.  Has taken Tylenol with good relief of the pain.   Past Medical History:  Diagnosis Date   Allergy    Angio-edema    Asbestos exposure    Cataract    Chronic cough    Dyspnea    chronic   Exposure to other animate mechanical forces, initial encounter    Heart murmur    History of kidney stones    Hyperlipidemia    Hyperlipidemia, unspecified    Localized swelling, mass and lump, right lower limb    Moderate persistent asthma without complication 9/52/8413   OSA (obstructive sleep apnea) 03/10/2019   Recurrent upper respiratory infection (URI)    Swelling of throat 02/24/2019   Unspecified chronic bronchitis [facility])     Patient Active Problem List   Diagnosis Date Noted   COVID-19 10/22/2020   Annual physical exam 04/10/2020   Aortic stenosis 04/10/2020   Murmur 10/04/2019   Edema, peripheral 04/05/2019   Essential hypertension 04/05/2019   Moderate persistent asthma without complication 24/40/1027   Seasonal and perennial allergic rhinitis 03/23/2019   Asbestos exposure 03/10/2019   Hyperlipidemia, unspecified    Localized swelling, mass and lump, right lower limb     Past Surgical History:  Procedure Laterality Date   CATARACT EXTRACTION W/PHACO Right 03/19/2015   Procedure: CATARACT EXTRACTION PHACO AND INTRAOCULAR LENS PLACEMENT; CDE:  9.70;  Surgeon: Williams Che, MD;  Location:  [facility];  Service: Ophthalmology;  Laterality: Right;   CATARACT EXTRACTION W/PHACO Left 03/16/2017   Procedure: CATARACT EXTRACTION PHACO AND INTRAOCULAR LENS PLACEMENT (IOC);  Surgeon: Tonny Branch, MD;  Location: [facility];  Service: Ophthalmology;  Laterality: Left;  CDE: 26.41   EYE SURGERY         Home Medications    Prior to Admission medications   Medication Sig Start Date End Date Taking? Authorizing Provider  Benralizumab [facility]) 30 MG/ML SOSY Inject into the skin.   Yes [provider]  lidocaine (LIDODERM) 5 % Place 1 patch onto the skin daily. Remove & Discard patch within 12 hours or as directed by MD 03/01/21  Yes Volney American, PA-C  acetaminophen (TYLENOL) 500 MG tablet Take 1,000 mg by mouth daily.    [provider]  atorvastatin (LIPITOR) 20 MG tablet Take 0.5 tablets (10 mg total) by mouth daily. 10/16/20   Noreene Larsson, NP  benzonatate (TESSALON) 100 MG capsule Take 1 capsule (100 mg total) by mouth 2 (two) times daily as needed for cough. 10/22/20   Noreene Larsson, NP  cetirizine (ZYRTEC) 10 MG tablet Take 10 mg by mouth daily.    [provider]  Chlorpheniramine-DM (CORICIDIN COUGH/COLD) 4-30 MG TABS Take 1 tablet by mouth every 6 (six) hours as needed. 10/22/20   Noreene Larsson, NP    Family History Family History  Problem  Relation Age of Onset   Cancer Mother    COPD Father    Allergic rhinitis Neg Hx    Angioedema Neg Hx    Asthma Neg Hx    Atopy Neg Hx    Eczema Neg Hx    Immunodeficiency Neg Hx    Urticaria Neg Hx     Social History Social History   Tobacco Use   Smoking status: Former    Packs/day: 2.00    Years: 30.00    Pack years: 60.00    Types: Cigarettes    Quit date: 03/12/1978    Years since quitting: 43.0   Smokeless tobacco: Never  Vaping Use   Vaping Use: Never used  Substance Use Topics   Alcohol use: No   Drug use: No     Allergies   Ceftin [cefuroxime axetil]   Review of  Systems Review of Systems Per HPI  Physical Exam Triage Vital Signs ED Triage Vitals  Enc Vitals Group     BP 03/01/21 1127 (!) 141/72     Pulse Rate 03/01/21 1127 79     Resp 03/01/21 1127 18     Temp 03/01/21 1127 98.2 F (36.8 C)     Temp Source 03/01/21 1127 Oral     SpO2 03/01/21 1127 96 %     Weight --      Height --      Head Circumference --      Peak Flow --      Pain Score 03/01/21 1128 5     Pain Loc --      Pain Edu? --      Excl. in [place]? --    No data found.  Updated Vital Signs BP (!) 141/72 (BP Location: Right Arm)    Pulse 79    Temp 98.2 F (36.8 C) (Oral)    Resp 18    SpO2 96%   Visual Acuity Right Eye Distance:   Left Eye Distance:   Bilateral Distance:    Right Eye Near:   Left Eye Near:    Bilateral Near:     Physical Exam Vitals and nursing note reviewed.  Constitutional:      Appearance: Normal appearance.  HENT:     Head: Atraumatic.  Eyes:     Extraocular Movements: Extraocular movements intact.     Conjunctiva/sclera: Conjunctivae normal.  Cardiovascular:     Rate and Rhythm: Normal rate and regular rhythm.  Pulmonary:     Effort: Pulmonary effort is normal.     Breath sounds: Normal breath sounds. No wheezing or rales.     Comments: Chest rise symmetric bilaterally.  Breath sounds equal and full bilaterally Musculoskeletal:        General: Tenderness present. Normal range of motion.     Cervical back: Normal range of motion and neck supple.     Comments: Minimal point tenderness to palpation left lateral ribs.  No bruising, redness, bony deformity palpable  Skin:    General: Skin is warm and dry.     Findings: No bruising or erythema.  Neurological:     General: No focal deficit present.     Mental Status: He is oriented to person, place, and time.     Motor: No weakness.     Gait: Gait normal.  Psychiatric:        Mood and Affect: Mood normal.        Thought Content: Thought content normal.  Judgment: Judgment  normal.     UC Treatments / Results  Labs (all labs ordered are listed, but only abnormal results are displayed) Labs Reviewed - No data to display  EKG   Radiology DG Ribs Unilateral W/Chest Left  Result Date: 03/01/2021 CLINICAL DATA:  Left lateral rib pain for 3 days EXAM: LEFT RIBS AND CHEST - 3+ VIEW COMPARISON:  Chest radiograph 04/05/2019, CT chest 08/17/2003 FINDINGS: The cardiomediastinal silhouette is normal. There is no focal consolidation or pulmonary edema. There is no pleural effusion or pneumothorax. Irregular densities projecting over both lungs are consistent with pleural plaques as seen on prior CT. There is no displaced rib fracture or other acute osseous abnormality. IMPRESSION: No displaced rib fracture or other acute osseous abnormality. Electronically Signed   By: Valetta Mole M.D.   On: 03/01/2021 12:23    Procedures Procedures (including critical care time)  Medications Ordered in UC Medications - No data to display  Initial Impression / Assessment and Plan / UC Course  I have reviewed the triage vital signs and the nursing notes.  Pertinent labs & imaging results that were available during my care of the patient were reviewed by me and considered in my medical decision making (see chart for details).     X-ray of the left ribs negative for acute bony abnormality, suspect contusion from the pressure of laying against this area.  Lidocaine patches, heat, Tylenol.  Return for worsening symptoms.  Final Clinical Impressions(s) / UC Diagnoses   Final diagnoses:  Contusion of rib, left, initial encounter   Discharge Instructions   None    ED Prescriptions     Medication Sig Dispense Auth. Provider   lidocaine (LIDODERM) 5 % Place 1 patch onto the skin daily. Remove & Discard patch within 12 hours or as directed by MD 50 patch Volney American, PA-C      PDMP not reviewed this encounter.   Volney American, [place] 03/01/21 1241

## 2021-03-01 NOTE — ED Triage Notes (Signed)
Patient states that he thinks he has a bruise on his left side ares near his rib for 3 days  Patient states that he was working under the [employer] and was laying on that side and felt pain.  He states his side only hurts when he is moving or laying down. If he is sitting up it does not hurt.

## 2021-03-05 ENCOUNTER — Other Ambulatory Visit: Payer: Self-pay | Admitting: *Deleted

## 2021-03-05 MED ORDER — FASENRA 30 MG/ML ~~LOC~~ SOSY
30.0000 mg | PREFILLED_SYRINGE | SUBCUTANEOUS | 6 refills | Status: DC
Start: 2021-03-05 — End: 2021-03-07

## 2021-03-07 ENCOUNTER — Other Ambulatory Visit: Payer: Self-pay | Admitting: *Deleted

## 2021-03-07 MED ORDER — FASENRA 30 MG/ML ~~LOC~~ SOSY: 30.0000 mg | PREFILLED_SYRINGE | SUBCUTANEOUS | 6 refills | Status: DC

## 2021-03-15 ENCOUNTER — Ambulatory Visit: Payer: Medicare Other

## 2021-03-15 ENCOUNTER — Ambulatory Visit (INDEPENDENT_AMBULATORY_CARE_PROVIDER_SITE_OTHER): Payer: Medicare Other

## 2021-03-15 ENCOUNTER — Other Ambulatory Visit: Payer: Self-pay

## 2021-03-15 DIAGNOSIS — J4550 Severe persistent asthma, uncomplicated: Secondary | ICD-10-CM | POA: Diagnosis not present

## 2021-04-06 LAB — CBC WITH DIFFERENTIAL/PLATELET
Basophils Absolute: 0 10*3/uL (ref 0.0–0.2)
Basos: 0 %
EOS (ABSOLUTE): 0 10*3/uL (ref 0.0–0.4)
Eos: 0 %
Hematocrit: 43.5 % (ref 37.5–51.0)
Hemoglobin: 14.9 g/dL (ref 13.0–17.7)
Immature Grans (Abs): 0 10*3/uL (ref 0.0–0.1)
Immature Granulocytes: 0 %
Lymphocytes Absolute: 2.9 10*3/uL (ref 0.7–3.1)
Lymphs: 41 %
MCH: 30.8 pg (ref 26.6–33.0)
MCHC: 34.3 g/dL (ref 31.5–35.7)
MCV: 90 fL (ref 79–97)
Monocytes Absolute: 0.6 10*3/uL (ref 0.1–0.9)
Monocytes: 9 %
Neutrophils Absolute: 3.5 10*3/uL (ref 1.4–7.0)
Neutrophils: 50 %
Platelets: 234 10*3/uL (ref 150–450)
RBC: 4.84 x10E6/uL (ref 4.14–5.80)
RDW: 11.6 % (ref 11.6–15.4)
WBC: 7.1 10*3/uL (ref 3.4–10.8)

## 2021-04-06 LAB — CMP14+EGFR
ALT: 13 IU/L (ref 0–44)
AST: 16 IU/L (ref 0–40)
Albumin/Globulin Ratio: 2.6 — ABNORMAL HIGH (ref 1.2–2.2)
Albumin: 4.4 g/dL (ref 3.6–4.6)
Alkaline Phosphatase: 116 IU/L (ref 44–121)
BUN/Creatinine Ratio: 13 (ref 10–24)
BUN: 16 mg/dL (ref 8–27)
Bilirubin Total: 0.5 mg/dL (ref 0.0–1.2)
CO2: 26 mmol/L (ref 20–29)
Calcium: 9 mg/dL (ref 8.6–10.2)
Chloride: 100 mmol/L (ref 96–106)
Creatinine, Ser: 1.26 mg/dL (ref 0.76–1.27)
Globulin, Total: 1.7 g/dL (ref 1.5–4.5)
Glucose: 119 mg/dL — ABNORMAL HIGH (ref 70–99)
Potassium: 4.9 mmol/L (ref 3.5–5.2)
Sodium: 138 mmol/L (ref 134–144)
Total Protein: 6.1 g/dL (ref 6.0–8.5)
eGFR: 57 mL/min/{1.73_m2} — ABNORMAL LOW (ref >59)

## 2021-04-06 LAB — LIPID PANEL WITH LDL/HDL RATIO
Cholesterol, Total: 163 mg/dL (ref 100–199)
HDL: 61 mg/dL (ref >39)
LDL Chol Calc (NIH): 84 mg/dL (ref 0–99)
LDL/HDL Ratio: 1.4 ratio (ref 0.0–3.6)
Triglycerides: 98 mg/dL (ref 0–149)
VLDL Cholesterol Cal: 18 mg/dL (ref 5–40)

## 2021-04-10 ENCOUNTER — Other Ambulatory Visit: Payer: Self-pay

## 2021-04-10 ENCOUNTER — Encounter: Payer: Medicare Other | Admitting: Nurse Practitioner

## 2021-04-10 ENCOUNTER — Encounter: Payer: Self-pay | Admitting: Internal Medicine

## 2021-04-10 ENCOUNTER — Ambulatory Visit (INDEPENDENT_AMBULATORY_CARE_PROVIDER_SITE_OTHER): Payer: Medicare Other | Admitting: Internal Medicine

## 2021-04-10 ENCOUNTER — Encounter (INDEPENDENT_AMBULATORY_CARE_PROVIDER_SITE_OTHER): Payer: Self-pay

## 2021-04-10 VITALS — BP 118/68 | HR 89 | Resp 18 | Ht 68.0 in | Wt 182.0 lb

## 2021-04-10 DIAGNOSIS — R7303 Prediabetes: Secondary | ICD-10-CM

## 2021-04-10 DIAGNOSIS — R911 Solitary pulmonary nodule: Secondary | ICD-10-CM | POA: Insufficient documentation

## 2021-04-10 DIAGNOSIS — I5189 Other ill-defined heart diseases: Secondary | ICD-10-CM | POA: Insufficient documentation

## 2021-04-10 DIAGNOSIS — E559 Vitamin D deficiency, unspecified: Secondary | ICD-10-CM | POA: Insufficient documentation

## 2021-04-10 DIAGNOSIS — I517 Cardiomegaly: Secondary | ICD-10-CM | POA: Insufficient documentation

## 2021-04-10 DIAGNOSIS — H269 Unspecified cataract: Secondary | ICD-10-CM | POA: Insufficient documentation

## 2021-04-10 DIAGNOSIS — H903 Sensorineural hearing loss, bilateral: Secondary | ICD-10-CM | POA: Insufficient documentation

## 2021-04-10 DIAGNOSIS — R0609 Other forms of dyspnea: Secondary | ICD-10-CM | POA: Insufficient documentation

## 2021-04-10 DIAGNOSIS — I350 Nonrheumatic aortic (valve) stenosis: Secondary | ICD-10-CM | POA: Diagnosis not present

## 2021-04-10 DIAGNOSIS — Z0001 Encounter for general adult medical examination with abnormal findings: Secondary | ICD-10-CM | POA: Diagnosis not present

## 2021-04-10 DIAGNOSIS — E782 Mixed hyperlipidemia: Secondary | ICD-10-CM

## 2021-04-10 DIAGNOSIS — J4540 Moderate persistent asthma, uncomplicated: Secondary | ICD-10-CM

## 2021-04-10 MED ORDER — ALBUTEROL SULFATE HFA 108 (90 BASE) MCG/ACT IN AERS: 2.0000 | INHALATION_SPRAY | Freq: Four times a day (QID) | RESPIRATORY_TRACT | 2 refills | Status: DC | PRN

## 2021-04-10 NOTE — Assessment & Plan Note (Signed)

## 2021-04-10 NOTE — Assessment & Plan Note (Signed)
Fasting blood glucose elevated, likely prediabetes Continue to follow low carb diet for now

## 2021-04-10 NOTE — Assessment & Plan Note (Signed)
Followed by Allergy clinic Refilled Albuterol, needs to use PRN for dyspnea

## 2021-04-10 NOTE — Assessment & Plan Note (Signed)
Had audiology testing at [place] new hearing aide

## 2021-04-10 NOTE — Assessment & Plan Note (Signed)
On Lipitor Lipid profile reviewed 

## 2021-04-10 NOTE — Patient Instructions (Signed)
Please continue taking medications as prescribed.  Please continue to follow low salt diet and perform moderate exercises as tolerated.

## 2021-04-10 NOTE — Assessment & Plan Note (Signed)
Has systolic murmur, although Echo showed only trace AS. Followed by Cardiology Asymptomatic currently 

## 2021-04-10 NOTE — Progress Notes (Addendum)
Established Patient Office Visit  Subjective:  Patient ID: Ryan Hanson, male    DOB: [birth year]  Age: 86 y.o. MRN: [id number]  CC:  Chief Complaint  Patient presents with   Annual Exam    Annual exam     HPI Ryan Hanson is a 86 y.o. male with past medical history of asthma and HLD who presents for annual physical.  He has been doing well overall.  He recently went to cardiologist for concern for AAS.  His last echo showed trace PI.  He has chronic, intermittent, mild leg swelling, which has improved since he stopped using sweet tea with meals.  He is up-to-date with pneumococcal and Shingrix vaccines.  Blood test where reviewed and discussed with the patient in detail.   Past Medical History:  Diagnosis Date   Allergy    Angio-edema    Asbestos exposure    Cataract    Chronic cough    Dyspnea    chronic   Exposure to other animate mechanical forces, initial encounter    Heart murmur    History of kidney stones    Hyperlipidemia    Hyperlipidemia, unspecified    Localized swelling, mass and lump, right lower limb    Moderate persistent asthma without complication 03/23/2019   OSA (obstructive sleep apnea) 03/10/2019   Recurrent upper respiratory infection (URI)    Swelling of throat 02/24/2019   Unspecified chronic bronchitis (HCC)     Past Surgical History:  Procedure Laterality Date   CATARACT EXTRACTION W/PHACO Right 03/19/2015   Procedure: CATARACT EXTRACTION PHACO AND INTRAOCULAR LENS PLACEMENT; CDE:  9.70;  Surgeon: Susa Simmonds, MD;  Location: [facility];  Service: Ophthalmology;  Laterality: Right;   CATARACT EXTRACTION W/PHACO Left 03/16/2017   Procedure: CATARACT EXTRACTION PHACO AND INTRAOCULAR LENS PLACEMENT (IOC);  Surgeon: Gemma Payor, MD;  Location: [facility];  Service: Ophthalmology;  Laterality: Left;  CDE: 26.41   EYE SURGERY      Family History  Problem Relation Age of Onset   Cancer Mother    COPD Father    Allergic rhinitis Neg Hx     Angioedema Neg Hx    Asthma Neg Hx    Atopy Neg Hx    Eczema Neg Hx    Immunodeficiency Neg Hx    Urticaria Neg Hx     Social History   Socioeconomic History   Marital status: Widowed    Spouse name: Not on file   Number of children: 1   Years of education: Not on file   Highest education level: Not on file  Occupational History    Comment: Retired, Nature conservation officer   Tobacco Use   Smoking status: Former    Packs/day: 2.00    Years: 30.00    Pack years: 60.00    Types: Cigarettes    Quit date: 03/12/1978    Years since quitting: 43.1   Smokeless tobacco: Never  Vaping Use   Vaping Use: Never used  Substance and Sexual Activity   Alcohol use: No   Drug use: No   Sexual activity: Not Currently    Birth control/protection: None  Other Topics Concern   Not on file  Social History Narrative   Lives alone    Dog: Celenia- [place]       Son lives close       Enjoys: helping people with things, gym work       Diet: eats all food groups    Caffeine:  decaf    Water: 6-8 cups a day       Wears seat belt   Does not use phone while driving    Smoke Psychologist, prison and probation services in home, safe area    Social Determinants of Health   Financial Resource Strain: Low Risk    Difficulty of Paying Living Expenses: Not hard at all  Food Insecurity: No Food Insecurity   Worried About Programme researcher, broadcasting/film/video in the Last Year: Never true   Barista in the Last Year: Never true  Transportation Needs: No Transportation Needs   Lack of Transportation (Medical): No   Lack of Transportation (Non-Medical): No  Physical Activity: Sufficiently Active   Days of Exercise per Week: 4 days   Minutes of Exercise per Session: 120 min  Stress: No Stress Concern Present   Feeling of Stress : Not at all  Social Connections: Moderately Isolated   Frequency of Communication with Friends and Family: Three times a week   Frequency of Social Gatherings with Friends and Family: More  than three times a week   Attends Religious Services: More than 4 times per year   Active Member of Clubs or Organizations: No   Attends Banker Meetings: Never   Marital Status: Widowed  Catering manager Violence: Not At Risk   Fear of Current or Ex-Partner: No   Emotionally Abused: No   Physically Abused: No   Sexually Abused: No    Outpatient Medications Prior to Visit  Medication Sig Dispense Refill   acetaminophen (TYLENOL) 500 MG tablet Take 1,000 mg by mouth daily.     atorvastatin (LIPITOR) 20 MG tablet Take 0.5 tablets (10 mg total) by mouth daily. 90 tablet 1   Benralizumab (FASENRA) 30 MG/ML SOSY Inject 1 mL (30 mg total) into the skin every 8 (eight) weeks. 1 mL 6   cetirizine (ZYRTEC) 10 MG tablet Take 10 mg by mouth daily.     lidocaine (LIDODERM) 5 % Place 1 patch onto the skin daily. Remove & Discard patch within 12 hours or as directed by MD 30 patch 0   benzonatate (TESSALON) 100 MG capsule Take 1 capsule (100 mg total) by mouth 2 (two) times daily as needed for cough. (Patient not taking: Reported on 04/10/2021) 20 capsule 0   Chlorpheniramine-DM (CORICIDIN COUGH/COLD) 4-30 MG TABS Take 1 tablet by mouth every 6 (six) hours as needed. (Patient not taking: Reported on 04/10/2021) 28 tablet 0   Facility-Administered Medications Prior to Visit  Medication Dose Route Frequency Provider Last Rate Last Admin   Benralizumab SOSY 30 mg  30 mg Subcutaneous Q8 Thomes Dinning, MD   30 mg at 03/15/21 5784    Allergies  Allergen Reactions   Ceftin [Cefuroxime Axetil]     ROS Review of Systems  Constitutional:  Negative for chills and fever.  HENT:  Positive for hearing loss. Negative for congestion and sore throat.   Eyes:  Negative for pain and discharge.  Respiratory:  Negative for cough and shortness of breath.   Cardiovascular:  Positive for leg swelling. Negative for chest pain and palpitations.  Gastrointestinal:  Negative for diarrhea, nausea  and vomiting.  Endocrine: Negative for polydipsia and polyuria.  Genitourinary:  Negative for dysuria and hematuria.  Musculoskeletal:  Negative for neck pain and neck stiffness.  Skin:  Negative for rash.  Neurological:  Negative for dizziness, weakness, numbness and headaches.  Psychiatric/Behavioral:  Negative for  agitation and behavioral problems.      Objective:    Physical Exam Vitals reviewed.  Constitutional:      General: He is not in acute distress.    Appearance: He is not diaphoretic.  HENT:     Head: Normocephalic and atraumatic.     Nose: Nose normal.     Mouth/Throat:     Mouth: Mucous membranes are moist.  Eyes:     General: No scleral icterus.    Extraocular Movements: Extraocular movements intact.  Cardiovascular:     Rate and Rhythm: Normal rate and regular rhythm.     Pulses: Normal pulses.     Heart sounds: Murmur (Over right and left sternal border) heard.  Pulmonary:     Breath sounds: Normal breath sounds. No wheezing or rales.  Abdominal:     Palpations: Abdomen is soft.     Tenderness: There is no abdominal tenderness.  Musculoskeletal:     Cervical back: Neck supple. No tenderness.     Right lower leg: Edema (Trace) present.     Left lower leg: Edema (Trace) present.  Skin:    General: Skin is warm.     Findings: No rash.  Neurological:     General: No focal deficit present.     Mental Status: He is alert and oriented to person, place, and time.     Cranial Nerves: No cranial nerve deficit.     Sensory: No sensory deficit.     Motor: No weakness.  Psychiatric:        Mood and Affect: Mood normal.        Behavior: Behavior normal.    BP 118/68 (BP Location: Right Arm, Patient Position: Sitting, Cuff Size: Normal)   Pulse 89   Resp 18   Ht  (1.727 m)   Wt 182 lb (82.6 kg)   SpO2 98%   BMI 27.67 kg/m  Wt Readings from Last 3 Encounters:  04/10/21 182 lb (82.6 kg)  08/27/20 179 lb 4 oz (81.3 kg)  04/10/20 179 lb (81.2 kg)     Lab Results  Component Value Date   TSH 1.99 11/10/2018   Lab Results  Component Value Date   WBC 7.1 04/05/2021   HGB 14.9 04/05/2021   HCT 43.5 04/05/2021   MCV 90 04/05/2021   PLT 234 04/05/2021   Lab Results  Component Value Date   NA 138 04/05/2021   K 4.9 04/05/2021   CO2 26 04/05/2021   GLUCOSE 119 (H) 04/05/2021   BUN 16 04/05/2021   CREATININE 1.26 04/05/2021   BILITOT 0.5 04/05/2021   ALKPHOS 116 04/05/2021   AST 16 04/05/2021   ALT 13 04/05/2021   PROT 6.1 04/05/2021   ALBUMIN 4.4 04/05/2021   CALCIUM 9.0 04/05/2021   ANIONGAP 8 03/13/2015   EGFR 57 (L) 04/05/2021   Lab Results  Component Value Date   CHOL 163 04/05/2021   Lab Results  Component Value Date   HDL 61 04/05/2021   Lab Results  Component Value Date   LDLCALC 84 04/05/2021   Lab Results  Component Value Date   TRIG 98 04/05/2021   Lab Results  Component Value Date   CHOLHDL 3.4 05/02/2019   Lab Results  Component Value Date   HGBA1C 5.5 05/02/2019      Assessment & Plan:   Problem List Items Addressed This Visit       Cardiovascular and Mediastinum   Aortic stenosis    Has systolic  murmur, although Echo showed only trace AS. Followed by Cardiology Asymptomatic currently        Respiratory   Moderate persistent asthma without complication    Followed by Allergy clinic Refilled Albuterol, needs to use PRN for dyspnea      Relevant Medications   albuterol (VENTOLIN HFA) 108 (90 Base) MCG/ACT inhaler     Nervous and Auditory   Sensorineural hearing loss, bilateral    [facility] aud[facility] Needs new hearing aide        Other   Hyperlipidemia, unspecified    On Lipitor Lipid profile reviewed      Encounter for general adult medical examination with abnormal findings - Primary    Physical exam as documented. Counseling done  re healthy lifestyle involving commitment to 150 minutes exercise per week, heart healthy diet, and attaining healthy  weight.The importance of adequate sleep also discussed. Changes in health habits are decided on by the patient with goals and time frames  set for achieving them. Immunization and cancer screening needs are specifically addressed at this visit.      Prediabetes    Fasting blood glucose elevated, likely prediabetes Continue to follow low carb diet for now       Meds ordered this encounter  Medications   albuterol (VENTOLIN HFA) 108 (90 Base) MCG/ACT inhaler    Sig: Inhale 2 puffs into the lungs every 6 (six) hours as needed for wheezing or shortness of breath.    Dispense:  8 g    Refill:  2    Follow-up: Return in about 1 year (around 04/10/2022), or if symptoms worsen or fail to improve.    Anabel Halon, MD

## 2021-05-10 ENCOUNTER — Other Ambulatory Visit: Payer: Self-pay

## 2021-05-10 ENCOUNTER — Ambulatory Visit (INDEPENDENT_AMBULATORY_CARE_PROVIDER_SITE_OTHER): Payer: Medicare Other | Admitting: *Deleted

## 2021-05-10 DIAGNOSIS — J4550 Severe persistent asthma, uncomplicated: Secondary | ICD-10-CM | POA: Diagnosis not present

## 2021-07-05 ENCOUNTER — Ambulatory Visit (INDEPENDENT_AMBULATORY_CARE_PROVIDER_SITE_OTHER): Payer: Medicare Other

## 2021-07-05 DIAGNOSIS — J4550 Severe persistent asthma, uncomplicated: Secondary | ICD-10-CM | POA: Diagnosis not present

## 2021-08-30 ENCOUNTER — Ambulatory Visit (INDEPENDENT_AMBULATORY_CARE_PROVIDER_SITE_OTHER): Payer: Medicare Other

## 2021-08-30 DIAGNOSIS — J4550 Severe persistent asthma, uncomplicated: Secondary | ICD-10-CM | POA: Diagnosis not present

## 2021-09-18 ENCOUNTER — Ambulatory Visit (INDEPENDENT_AMBULATORY_CARE_PROVIDER_SITE_OTHER): Payer: Medicare Other | Admitting: *Deleted

## 2021-09-18 DIAGNOSIS — Z0000 Encounter for general adult medical examination without abnormal findings: Secondary | ICD-10-CM | POA: Diagnosis not present

## 2021-09-18 NOTE — Progress Notes (Signed)
Subjective:   Ryan Hanson is a 86 y.o. male who presents for Medicare Annual/Subsequent preventive examination.  I connected with  Ryan Hanson on 09/18/21 by a audio enabled telemedicine application and verified that I am speaking with the correct person using two identifiers.  Patient Location: Home  Provider Location: Office/Clinic  I discussed the limitations of evaluation and management by telemedicine. The patient expressed understanding and agreed to proceed.  Review of Systems     Ryan Hanson , Thank you for taking time to come for your Medicare Wellness Visit. I appreciate your ongoing commitment to your health goals. Please review the following plan we discussed and let me know if I can assist you in the future.   These are the goals we discussed:  Goals      Prevent falls        This is a list of the screening recommended for you and due dates:  Health Maintenance  Topic Date Due   COVID-19 Vaccine (6 - Pfizer series) 03/01/2020   Flu Shot  10/01/2021   Tetanus Vaccine  12/02/2023   Pneumonia Vaccine  Completed   Zoster (Shingles) Vaccine  Completed   HPV Vaccine  Aged Out          Objective:    There were no vitals filed for this visit. There is no height or weight on file to calculate BMI.     08/27/2020    2:34 PM 03/16/2017    7:03 AM 02/04/2017    1:57 PM 03/13/2015   10:02 AM  Advanced Directives  Does Patient Have a Medical Advance Directive? No No No No  Would patient like information on creating a medical advance directive? No - Patient declined No - Patient declined  No - patient declined information    Current Medications (verified) Outpatient Encounter Medications as of 09/18/2021  Medication Sig   acetaminophen (TYLENOL) 500 MG tablet Take 1,000 mg by mouth daily.   albuterol (VENTOLIN HFA) 108 (90 Base) MCG/ACT inhaler Inhale 2 puffs into the lungs every 6 (six) hours as needed for wheezing or shortness of breath.   atorvastatin  (LIPITOR) 20 MG tablet Take 0.5 tablets (10 mg total) by mouth daily.   Benralizumab (FASENRA) 30 MG/ML SOSY Inject 1 mL (30 mg total) into the skin every 8 (eight) weeks.   cetirizine (ZYRTEC) 10 MG tablet Take 10 mg by mouth daily.   lidocaine (LIDODERM) 5 % Place 1 patch onto the skin daily. Remove & Discard patch within 12 hours or as directed by MD   Facility-Administered Encounter Medications as of 09/18/2021  Medication   Benralizumab SOSY 30 mg    Allergies (verified) Ceftin [cefuroxime axetil]   History: Past Medical History:  Diagnosis Date   Allergy    Angio-edema    Asbestos exposure    Cataract    Chronic cough    Dyspnea    chronic   Exposure to other animate mechanical forces, initial encounter    Heart murmur    History of kidney stones    Hyperlipidemia    Hyperlipidemia, unspecified    Localized swelling, mass and lump, right lower limb    Moderate persistent asthma without complication 1/94/1740   OSA (obstructive sleep apnea) 03/10/2019   Recurrent upper respiratory infection (URI)    Swelling of throat 02/24/2019   Unspecified chronic bronchitis [facility])    Past Surgical History:  Procedure Laterality Date   CATARACT EXTRACTION W/PHACO Right 03/19/2015  Procedure: CATARACT EXTRACTION PHACO AND INTRAOCULAR LENS PLACEMENT; CDE:  9.70;  Surgeon: Williams Che, MD;  Location: [facility];  Service: Ophthalmology;  Laterality: Right;   CATARACT EXTRACTION W/PHACO Left 03/16/2017   Procedure: CATARACT EXTRACTION PHACO AND INTRAOCULAR LENS PLACEMENT (IOC);  Surgeon: Tonny Branch, MD;  Location: [facility];  Service: Ophthalmology;  Laterality: Left;  CDE: 26.41   EYE SURGERY     Family History  Problem Relation Age of Onset   Cancer Mother    COPD Father    Allergic rhinitis Neg Hx    Angioedema Neg Hx    Asthma Neg Hx    Atopy Neg Hx    Eczema Neg Hx    Immunodeficiency Neg Hx    Urticaria Neg Hx    Social History   Socioeconomic History   Marital status:  Widowed    Spouse name: Not on file   Number of children: 1   Years of education: Not on file   Highest education level: Not on file  Occupational History    Comment: Retired, Education officer, museum   Tobacco Use   Smoking status: Former    Packs/day: 2.00    Years: 30.00    Total pack years: 60.00    Types: Cigarettes    Quit date: 03/12/1978    Years since quitting: 43.5   Smokeless tobacco: Never  Vaping Use   Vaping Use: Never used  Substance and Sexual Activity   Alcohol use: No   Drug use: No   Sexual activity: Not Currently    Birth control/protection: None  Other Topics Concern   Not on file  Social History Narrative   Lives alone    Dog: Celenia- [place]       Son lives close       Enjoys: helping people with things, gym work       Diet: eats all food groups    Caffeine: decaf    Water: 6-8 cups a day       Wears seat belt   Does not use phone while driving    Smoke Midwife in home, safe area    Social Determinants of Health   Financial Resource Strain: [place]  (08/27/2020)   Overall Financial Resource Strain (CARDIA)    Difficulty of Paying Living Expenses: Not hard at all  Food Insecurity: No Food Insecurity (08/27/2020)   Hunger Vital Sign    Worried About Running Out of Food in the Last Year: Never true    [place] in the Last Year: Never true  Transportation Needs: No Transportation Needs (08/27/2020)   PRAPARE - Hydrologist (Medical): No    Lack of Transportation (Non-Medical): No  Physical Activity: Sufficiently Active (08/27/2020)   Exercise Vital Sign    Days of Exercise per Week: 4 days    Minutes of Exercise per Session: 120 min  Stress: No Stress Concern Present (08/27/2020)   [place]    Feeling of Stress : Not at all  Social Connections: Moderately Isolated (08/27/2020)   Social Connection and Isolation Panel  [NHANES]    Frequency of Communication with Friends and Family: Three times a week    Frequency of Social Gatherings with Friends and Family: More than three times a week    Attends Religious Services: More than 4 times per year    Active Member of  Clubs or Organizations: No    Attends Archivist Meetings: Never    Marital Status: Widowed    Tobacco Counseling Counseling given: Not Answered   Clinical Intake:              How often do you need to have someone help you when you read instructions, pamphlets, or other written materials from your doctor or pharmacy?: (P) 1 - Never  Diabetic?no         Activities of Daily Living    09/17/2021   12:35 PM  In your present state of health, do you have any difficulty performing the following activities:  Hearing? 0  Vision? 0  Difficulty concentrating or making decisions? 1  Walking or climbing stairs? 0  Dressing or bathing? 0  Doing errands, shopping? 0  Preparing Food and eating ? N  Using the Toilet? N  In the past six months, have you accidently leaked urine? Y  Do you have problems with loss of bowel control? N  Managing your Medications? N  Managing your Finances? N  Housekeeping or managing your Housekeeping? N    Patient Care Team: Lindell Spar, MD as PCP - General (Internal Medicine)  Indicate any recent Medical Services you may have received from other than [facility] providers in the past year (date may be approximate).     Assessment:   This is a routine wellness examination for Ryan Hanson.  Hearing/Vision screen No results found.  Dietary issues and exercise activities discussed:     Goals Addressed   None   Depression Screen    04/10/2021   10:58 AM 10/22/2020    1:19 PM 08/27/2020    2:46 PM 08/27/2020    2:35 PM 04/10/2020    1:07 PM 10/04/2019   12:58 PM 06/29/2019    1:38 PM  PHQ 2/9 Scores  PHQ - 2 Score 0 0 0 0 0 0 0    Fall Risk    09/17/2021   12:35 PM 04/10/2021   10:58 AM  10/22/2020    1:19 PM 08/27/2020    2:35 PM 04/10/2020    1:07 PM  Fall Risk   Falls in the past year? 0 0 0 0 0  Number falls in past yr:  0 0 0 0  Injury with Fall? 0 0 0 0 0  Risk for fall due to :  No Fall Risks No Fall Risks No Fall Risks No Fall Risks  Follow up  Falls evaluation completed Falls evaluation completed Falls evaluation completed;Falls prevention discussed Falls evaluation completed    FALL RISK PREVENTION PERTAINING TO THE HOME:  Any stairs in or around the home? No  If so, are there any without handrails? No  Home free of loose throw rugs in walkways, pet beds, electrical cords, etc? Yes  Adequate lighting in your home to reduce risk of falls? Yes   ASSISTIVE DEVICES UTILIZED TO PREVENT FALLS:  Life alert? No  Use of a cane, walker or w/c? No  Grab bars in the bathroom? No  Shower chair or bench in shower? Yes  Elevated toilet seat or a handicapped toilet? Yes    Cognitive Function:        Immunizations Immunization History  Administered Date(s) Administered   Fluad Quad(high Dose 65+) 01/10/2020, 01/14/2021   Influenza Split 03/21/2016, 12/25/2017, 11/09/2018   Influenza, High Dose Seasonal PF 03/03/2016, 03/05/2017   Influenza-Unspecified 02/14/2011, 01/13/2012, 01/18/2013, 03/15/2015, 11/02/2018   PFIZER(Purple Top)SARS-COV-2 Vaccination 01/12/2019, 04/16/2019, 04/20/2019,  05/11/2019, 01/05/2020   Pneumococcal Conjugate-13 10/27/2013, 12/02/2015   Pneumococcal Polysaccharide-23 08/12/2017   Tdap 10/27/2013, 12/01/2013   Zoster Recombinat (Shingrix) 10/10/2019, 01/10/2020    TDAP status: Up to date  Flu Vaccine status: Up to date  Pneumococcal vaccine status: Up to date  Covid-19 vaccine status: Completed vaccines  Qualifies for Shingles Vaccine? Yes   Zostavax completed: Yes Shingrix Completed?: Yes  Screening Tests Health Maintenance  Topic Date Due   COVID-19 Vaccine (6 - Pfizer series) 03/01/2020   INFLUENZA VACCINE  10/01/2021    TETANUS/TDAP  12/02/2023   Pneumonia Vaccine 50+ Years old  Completed   Zoster Vaccines- Shingrix  Completed   HPV VACCINES  Aged Out    Health Maintenance  Health Maintenance Due  Topic Date Due   COVID-19 Vaccine (6 - Pfizer series) 03/01/2020    Colorectal cancer screening: No longer required.   Lung Cancer Screening: (Low Dose CT Chest recommended if Age 65-80 years, 30 pack-year currently smoking OR have quit w/in 15years.) does not qualify.   Lung Cancer Screening Referral: NA  Additional Screening:  Hepatitis C Screening: does qualify; Completed   Vision Screening: Recommended annual ophthalmology exams for early detection of glaucoma and other disorders of the eye. Is the patient up to date with their annual eye exam?  Yes  Who is the provider or what is the name of the office in which the patient attends annual eye exams? [facility] If pt is not established with a provider, would they like to be referred to a provider to establish care? No .   Dental Screening: Recommended annual dental exams for proper oral hygiene  Community Resource Referral / Chronic Care Management: CRR required this visit?  No   CCM required this visit?  No      Plan:     I have personally reviewed and noted the following in the patient's chart:   Medical and social history Use of alcohol, tobacco or illicit drugs  Current medications and supplements including opioid prescriptions. Patient is not currently taking opioid prescriptions. Functional ability and status Nutritional status Physical activity Advanced directives List of other physicians Hospitalizations, surgeries, and ER visits in previous 12 months Vitals Screenings to include cognitive, depression, and falls Referrals and appointments  In addition, I have reviewed and discussed with patient certain preventive protocols, quality metrics, and best practice recommendations. A written personalized care plan for  preventive services as well as general preventive health recommendations were provided to patient.     Shelda Altes, CMA   09/18/2021   Nurse Notes:  Ryan Hanson , Thank you for taking time to come for your Medicare Wellness Visit. I appreciate your ongoing commitment to your health goals. Please review the following plan we discussed and let me know if I can assist you in the future.   These are the goals we discussed:  Goals      Prevent falls        This is a list of the screening recommended for you and due dates:  Health Maintenance  Topic Date Due   COVID-19 Vaccine (6 - Pfizer series) 03/01/2020   Flu Shot  10/01/2021   Tetanus Vaccine  12/02/2023   Pneumonia Vaccine  Completed   Zoster (Shingles) Vaccine  Completed   HPV Vaccine  Aged Out

## 2021-09-18 NOTE — Patient Instructions (Signed)
Ryan Hanson , Thank you for taking time to come for your Medicare Wellness Visit. I appreciate your ongoing commitment to your health goals. Please review the following plan we discussed and let me know if I can assist you in the future.   Screening recommendations/referrals: Recommended yearly ophthalmology/optometry visit for glaucoma screening and checkup Recommended yearly dental visit for hygiene and checkup  Vaccinations: Influenza vaccine: completed Pneumococcal vaccine: completed Tdap vaccine: completed Shingles vaccine: completed    Advanced directives: declined information  Conditions/risks identified: Falls  Next appointment: 1 year   Preventive Care 27 Years and Older, Male Preventive care refers to lifestyle choices and visits with your health care provider that can promote health and wellness. What does preventive care include? A yearly physical exam. This is also called an annual well check. Dental exams once or twice a year. Routine eye exams. Ask your health care provider how often you should have your eyes checked. Personal lifestyle choices, including: Daily care of your teeth and gums. Regular physical activity. Eating a healthy diet. Avoiding tobacco and drug use. Limiting alcohol use. Practicing safe sex. Taking low doses of aspirin every day. Taking vitamin and mineral supplements as recommended by your health care provider. What happens during an annual well check? The services and screenings done by your health care provider during your annual well check will depend on your age, overall health, lifestyle risk factors, and family history of disease. Counseling  Your health care provider may ask you questions about your: Alcohol use. Tobacco use. Drug use. Emotional well-being. Home and relationship well-being. Sexual activity. Eating habits. History of falls. Memory and ability to understand (cognition). Work and work Statistician. Screening   You may have the following tests or measurements: Height, weight, and BMI. Blood pressure. Lipid and cholesterol levels. These may be checked every 5 years, or more frequently if you are over 70 years old. Skin check. Lung cancer screening. You may have this screening every year starting at age 25 if you have a 30-pack-year history of smoking and currently smoke or have quit within the past 15 years. Fecal occult blood test (FOBT) of the stool. You may have this test every year starting at age 70. Flexible sigmoidoscopy or colonoscopy. You may have a sigmoidoscopy every 5 years or a colonoscopy every 10 years starting at age 26. Prostate cancer screening. Recommendations will vary depending on your family history and other risks. Hepatitis C blood test. Hepatitis B blood test. Sexually transmitted disease (STD) testing. Diabetes screening. This is done by checking your blood sugar (glucose) after you have not eaten for a while (fasting). You may have this done every 1-3 years. Abdominal aortic aneurysm (AAA) screening. You may need this if you are a current or former smoker. Osteoporosis. You may be screened starting at age 68 if you are at high risk. Talk with your health care provider about your test results, treatment options, and if necessary, the need for more tests. Vaccines  Your health care provider may recommend certain vaccines, such as: Influenza vaccine. This is recommended every year. Tetanus, diphtheria, and acellular pertussis (Tdap, Td) vaccine. You may need a Td booster every 10 years. Zoster vaccine. You may need this after age 42. Pneumococcal 13-valent conjugate (PCV13) vaccine. One dose is recommended after age 35. Pneumococcal polysaccharide (PPSV23) vaccine. One dose is recommended after age 61. Talk to your health care provider about which screenings and vaccines you need and how often you need them. This information is not  intended to replace advice given to you by  your health care provider. Make sure you discuss any questions you have with your health care provider. Document Released: 03/16/2015 Document Revised: 11/07/2015 Document Reviewed: 12/19/2014 Elsevier Interactive Patient Education  2017 [place] Prevention in the Home Falls can cause injuries. They can happen to people of all ages. There are many things you can do to make your home safe and to help prevent falls. What can I do on the outside of my home? Regularly fix the edges of walkways and driveways and fix any cracks. Remove anything that might make you trip as you walk through a door, such as a raised step or threshold. Trim any bushes or trees on the path to your home. Use bright outdoor lighting. Clear any walking paths of anything that might make someone trip, such as rocks or tools. Regularly check to see if handrails are loose or broken. Make sure that both sides of any steps have handrails. Any raised decks and porches should have guardrails on the edges. Have any leaves, snow, or ice cleared regularly. Use sand or salt on walking paths during winter. Clean up any spills in your garage right away. This includes oil or grease spills. What can I do in the bathroom? Use night lights. Install grab bars by the toilet and in the tub and shower. Do not use towel bars as grab bars. Use non-skid mats or decals in the tub or shower. If you need to sit down in the shower, use a plastic, non-slip stool. Keep the floor dry. Clean up any water that spills on the floor as soon as it happens. Remove soap buildup in the tub or shower regularly. Attach bath mats securely with double-sided non-slip rug tape. Do not have throw rugs and other things on the floor that can make you trip. What can I do in the bedroom? Use night lights. Make sure that you have a light by your bed that is easy to reach. Do not use any sheets or blankets that are too big for your bed. They should not hang  down onto the floor. Have a firm chair that has side arms. You can use this for support while you get dressed. Do not have throw rugs and other things on the floor that can make you trip. What can I do in the kitchen? Clean up any spills right away. Avoid walking on wet floors. Keep items that you use a lot in easy-to-reach places. If you need to reach something above you, use a strong step stool that has a grab bar. Keep electrical cords out of the way. Do not use floor polish or wax that makes floors slippery. If you must use wax, use non-skid floor wax. Do not have throw rugs and other things on the floor that can make you trip. What can I do with my stairs? Do not leave any items on the stairs. Make sure that there are handrails on both sides of the stairs and use them. Fix handrails that are broken or loose. Make sure that handrails are as long as the stairways. Check any carpeting to make sure that it is firmly attached to the stairs. Fix any carpet that is loose or worn. Avoid having throw rugs at the top or bottom of the stairs. If you do have throw rugs, attach them to the floor with carpet tape. Make sure that you have a light switch at the top of the stairs  and the bottom of the stairs. If you do not have them, ask someone to add them for you. What else can I do to help prevent falls? Wear shoes that: Do not have high heels. Have rubber bottoms. Are comfortable and fit you well. Are closed at the toe. Do not wear sandals. If you use a stepladder: Make sure that it is fully opened. Do not climb a closed stepladder. Make sure that both sides of the stepladder are locked into place. Ask someone to hold it for you, if possible. Clearly mark and make sure that you can see: Any grab bars or handrails. First and last steps. Where the edge of each step is. Use tools that help you move around (mobility aids) if they are needed. These  include: Canes. Walkers. Scooters. Crutches. Turn on the lights when you go into a dark area. Replace any light bulbs as soon as they burn out. Set up your furniture so you have a clear path. Avoid moving your furniture around. If any of your floors are uneven, fix them. If there are any pets around you, be aware of where they are. Review your medicines with your doctor. Some medicines can make you feel dizzy. This can increase your chance of falling. Ask your doctor what other things that you can do to help prevent falls. This information is not intended to replace advice given to you by your health care provider. Make sure you discuss any questions you have with your health care provider. Document Released: 12/14/2008 Document Revised: 07/26/2015 Document Reviewed: 03/24/2014 Elsevier Interactive Patient Education  2017 [employer].

## 2021-09-23 ENCOUNTER — Ambulatory Visit (INDEPENDENT_AMBULATORY_CARE_PROVIDER_SITE_OTHER): Payer: Medicare Other | Admitting: Internal Medicine

## 2021-09-23 ENCOUNTER — Encounter: Payer: Self-pay | Admitting: Internal Medicine

## 2021-09-23 VITALS — BP 124/74 | HR 61 | Resp 18 | Ht 68.0 in | Wt 182.2 lb

## 2021-09-23 DIAGNOSIS — M545 Low back pain, unspecified: Secondary | ICD-10-CM | POA: Diagnosis not present

## 2021-09-23 DIAGNOSIS — R42 Dizziness and giddiness: Secondary | ICD-10-CM

## 2021-09-23 DIAGNOSIS — R011 Cardiac murmur, unspecified: Secondary | ICD-10-CM | POA: Insufficient documentation

## 2021-09-23 LAB — POCT URINALYSIS DIP (CLINITEK)
Bilirubin, UA: NEGATIVE
Blood, UA: NEGATIVE
Glucose, UA: NEGATIVE mg/dL
Ketones, POC UA: NEGATIVE mg/dL
Nitrite, UA: NEGATIVE
POC PROTEIN,UA: NEGATIVE
Spec Grav, UA: 1.015 (ref 1.010–1.025)
Urobilinogen, UA: 0.2 E.U./dL
pH, UA: 6 (ref 5.0–8.0)

## 2021-09-23 MED ORDER — MECLIZINE HCL 25 MG PO TABS: 25.0000 mg | ORAL_TABLET | Freq: Two times a day (BID) | ORAL | 0 refills | Status: DC | PRN

## 2021-09-23 NOTE — Assessment & Plan Note (Signed)
Avoid sudden positional changes Meclizine as needed Vestibular exercises advised, material provided

## 2021-09-23 NOTE — Patient Instructions (Signed)
Please take Meclizine as needed for dizziness.  Please avoid sudden positional changes.  Please maintain adequate hydration by taking at least 64 ounces of fluid in a day.  Please avoid heavy lifting and frequent bending. Okay to take Tylenol and apply heating pad for back pain.

## 2021-09-23 NOTE — Assessment & Plan Note (Signed)
More likely MSK in etiology Avoid heavy lifting and frequent bending Tylenol as needed for pain Heating pad and/or back brace UA showed mild LE, check urine culture

## 2021-09-23 NOTE — Progress Notes (Signed)
Acute Office Visit  Subjective:    Patient ID: Ryan Hanson, male    DOB: 1935/04/21, 86 y.o.   MRN: [id number]  Chief Complaint  Patient presents with   Back Pain    Patient has lower back pain that has been going on since 08-24-21 wonders if this is coming from kidneys also had a dizzy spell when he got up 09-15-21    HPI Patient is in today for c/o left lower lumbar back pain/left flank pain for the last 1.5 months.  He denies any recent fall or injury.  Of note, he goes to [facility] for her exercises.  Denies any numbness, tingling or weakness of the LE.  He is back pain is intermittent, dull, nonradiating, worse in the morning.  Denies any dysuria, hematuria, fever or chills currently.  He also reports an episode of dizziness after micturition.  He complains of mild lightheadedness for the last few days, worse in the standing position, but also present while sitting.  Denies any nausea or vomiting.   Past Medical History:  Diagnosis Date   Allergy    Angio-edema    Asbestos exposure    Cataract    Chronic cough    Dyspnea    chronic   Exposure to other animate mechanical forces, initial encounter    Heart murmur    History of kidney stones    Hyperlipidemia    Hyperlipidemia, unspecified    Localized swelling, mass and lump, right lower limb    Moderate persistent asthma without complication 9/37/9024   OSA (obstructive sleep apnea) 03/10/2019   Recurrent upper respiratory infection (URI)    Swelling of throat 02/24/2019   Unspecified chronic bronchitis ([place])     Past Surgical History:  Procedure Laterality Date   CATARACT EXTRACTION W/PHACO Right 03/19/2015   Procedure: CATARACT EXTRACTION PHACO AND INTRAOCULAR LENS PLACEMENT; CDE:  9.70;  Surgeon: Williams Che, MD;  Location: [facility];  Service: Ophthalmology;  Laterality: Right;   CATARACT EXTRACTION W/PHACO Left 03/16/2017   Procedure: CATARACT EXTRACTION PHACO AND INTRAOCULAR LENS PLACEMENT (IOC);  Surgeon: Tonny Branch, MD;  Location: [facility];  Service: Ophthalmology;  Laterality: Left;  CDE: 26.41   EYE SURGERY      Family History  Problem Relation Age of Onset   Cancer Mother    COPD Father    Allergic rhinitis Neg Hx    Angioedema Neg Hx    Asthma Neg Hx    Atopy Neg Hx    Eczema Neg Hx    Immunodeficiency Neg Hx    Urticaria Neg Hx     Social History   Socioeconomic History   Marital status: Widowed    Spouse name: Not on file   Number of children: 1   Years of education: Not on file   Highest education level: Not on file  Occupational History    Comment: Retired, Education officer, museum   Tobacco Use   Smoking status: Former    Packs/day: 2.00    Years: 30.00    Total pack years: 60.00    Types: Cigarettes    Quit date: 03/12/1978    Years since quitting: 43.5   Smokeless tobacco: Never  Vaping Use   Vaping Use: Never used  Substance and Sexual Activity   Alcohol use: No   Drug use: No   Sexual activity: Not Currently    Birth control/protection: None  Other Topics Concern   Not on file  Social History Narrative  Lives alone    Dog: Celenia- y[place]ie       Son lives close       Enjoys: helping people with things, gym work       Diet: eats all food groups    Caffeine: decaf    Water: 6-8 cups a day       Wears seat belt   Does not use phone while driving    Smoke Midwife in home, safe area    Social Determinants of Health   Financial Resource Strain: Low Risk  (09/18/2021)   Overall Financial Resource Strain (CARDIA)    Difficulty of Paying Living Expenses: Not hard at all  Food Insecurity: No Food Insecurity (09/18/2021)   Hunger Vital Sign    Worried About Running Out of Food in the Last Year: Never true    [place] in the Last Year: Never true  Transportation Needs: No Transportation Needs (09/18/2021)   PRAPARE - Hydrologist (Medical): No    Lack of Transportation (Non-Medical): No  Physical  Activity: Sufficiently Active (09/18/2021)   Exercise Vital Sign    Days of Exercise per Week: 7 days    Minutes of Exercise per Session: 150+ min  Stress: No Stress Concern Present (09/18/2021)   [place]    Feeling of Stress : Not at all  Social Connections: Moderately Isolated (09/18/2021)   Social Connection and Isolation Panel [NHANES]    Frequency of Communication with Friends and Family: More than three times a week    Frequency of Social Gatherings with Friends and Family: More than three times a week    Attends Religious Services: More than 4 times per year    Active Member of [employer] or Organizations: No    Attends Archivist Meetings: Never    Marital Status: Widowed  Intimate Partner Violence: Not At Risk (09/18/2021)   Humiliation, Afraid, Rape, and Kick questionnaire    Fear of Current or Ex-Partner: No    Emotionally Abused: No    Physically Abused: No    Sexually Abused: No    Outpatient Medications Prior to Visit  Medication Sig Dispense Refill   acetaminophen (TYLENOL) 500 MG tablet Take 1,000 mg by mouth daily.     albuterol (VENTOLIN HFA) 108 (90 Base) MCG/ACT inhaler Inhale 2 puffs into the lungs every 6 (six) hours as needed for wheezing or shortness of breath. 8 g 2   atorvastatin (LIPITOR) 20 MG tablet Take 0.5 tablets (10 mg total) by mouth daily. 90 tablet 1   Benralizumab (FASENRA) 30 MG/ML SOSY Inject 1 mL (30 mg total) into the skin every 8 (eight) weeks. 1 mL 6   cetirizine (ZYRTEC) 10 MG tablet Take 10 mg by mouth daily.     Facility-Administered Medications Prior to Visit  Medication Dose Route Frequency Provider Last Rate Last Admin   Benralizumab SOSY 30 mg  30 mg Subcutaneous Q8 Toney Reil, MD   30 mg at 08/30/21 [id number]    Allergies  Allergen Reactions   Ceftin [Cefuroxime Axetil]     Review of Systems  Constitutional:  Negative for chills and fever.   HENT:  Positive for hearing loss. Negative for congestion and sore throat.   Eyes:  Negative for pain and discharge.  Respiratory:  Negative for cough and shortness of breath.   Cardiovascular:  Positive for  leg swelling. Negative for chest pain and palpitations.  Gastrointestinal:  Negative for diarrhea, nausea and vomiting.  Endocrine: Negative for polydipsia and polyuria.  Genitourinary:  Positive for frequency. Negative for dysuria and hematuria.  Musculoskeletal:  Positive for back pain. Negative for neck pain and neck stiffness.  Skin:  Negative for rash.  Neurological:  Positive for dizziness. Negative for weakness, numbness and headaches.  Psychiatric/Behavioral:  Negative for agitation and behavioral problems.        Objective:    Physical Exam Vitals reviewed.  Constitutional:      General: He is not in acute distress.    Appearance: He is not diaphoretic.  HENT:     Head: Normocephalic and atraumatic.     Nose: Nose normal.     Mouth/Throat:     Mouth: Mucous membranes are moist.  Eyes:     General: No scleral icterus.    Extraocular Movements: Extraocular movements intact.  Cardiovascular:     Rate and Rhythm: Normal rate and regular rhythm.     Pulses: Normal pulses.     Heart sounds: Murmur (Over right and left sternal border) heard.  Pulmonary:     Breath sounds: Normal breath sounds. No wheezing or rales.  Abdominal:     Palpations: Abdomen is soft.     Tenderness: There is no abdominal tenderness. There is no right CVA tenderness or left CVA tenderness.  Musculoskeletal:        General: Tenderness (Mild-left lower lumbar paraspinal area) present.     Cervical back: Neck supple. No tenderness.     Right lower leg: Edema (Trace) present.     Left lower leg: Edema (Trace) present.  Skin:    General: Skin is warm.     Findings: No rash.  Neurological:     General: No focal deficit present.     Mental Status: He is alert and oriented to person, place, and  time.     Sensory: No sensory deficit.     Motor: No weakness.  Psychiatric:        Mood and Affect: Mood normal.        Behavior: Behavior normal.     BP 124/74 (BP Location: Right Arm, Patient Position: Sitting, Cuff Size: Normal)   Pulse 61   Resp 18   Ht '5\' 8"'$  (1.727 m)   Wt 182 lb 3.2 oz (82.6 kg)   SpO2 97%   BMI 27.70 kg/m  Wt Readings from Last 3 Encounters:  09/23/21 182 lb 3.2 oz (82.6 kg)  04/10/21 182 lb (82.6 kg)  08/27/20 179 lb 4 oz (81.3 kg)        Assessment & Plan:   Problem List Items Addressed This Visit       Other   Low back pain - Primary    More likely MSK in etiology Avoid heavy lifting and frequent bending Tylenol as needed for pain Heating pad and/or back brace UA showed mild LE, check urine culture      Relevant Orders   POCT URINALYSIS DIP (CLINITEK) (Completed)   Urine Culture   Vertigo    Avoid sudden positional changes Meclizine as needed Vestibular exercises advised, material provided      Relevant Medications   meclizine (ANTIVERT) 25 MG tablet     Meds ordered this encounter  Medications   meclizine (ANTIVERT) 25 MG tablet    Sig: Take 1 tablet (25 mg total) by mouth 2 (two) times daily as needed for dizziness.  Dispense:  30 tablet    Refill:  0     Korde Jeppsen Keith Rake, MD

## 2021-09-25 LAB — URINE CULTURE: Organism ID, Bacteria: NO GROWTH

## 2021-10-28 ENCOUNTER — Encounter: Payer: Self-pay | Admitting: Allergy

## 2021-10-28 ENCOUNTER — Telehealth: Payer: Self-pay

## 2021-10-28 ENCOUNTER — Ambulatory Visit: Payer: Medicare Other

## 2021-10-28 ENCOUNTER — Ambulatory Visit (INDEPENDENT_AMBULATORY_CARE_PROVIDER_SITE_OTHER): Payer: Medicare Other | Admitting: Allergy

## 2021-10-28 VITALS — BP 128/70 | HR 71 | Temp 98.7°F | Resp 18 | Ht 67.0 in | Wt 184.4 lb

## 2021-10-28 DIAGNOSIS — J4540 Moderate persistent asthma, uncomplicated: Secondary | ICD-10-CM | POA: Diagnosis not present

## 2021-10-28 DIAGNOSIS — J3089 Other allergic rhinitis: Secondary | ICD-10-CM

## 2021-10-28 MED ORDER — ALBUTEROL SULFATE HFA 108 (90 BASE) MCG/ACT IN AERS: 2.0000 | INHALATION_SPRAY | Freq: Four times a day (QID) | RESPIRATORY_TRACT | 2 refills | Status: DC | PRN

## 2021-10-28 MED ORDER — BUDESONIDE-FORMOTEROL FUMARATE 160-4.5 MCG/ACT IN AERO: 2.0000 | INHALATION_SPRAY | Freq: Two times a day (BID) | RESPIRATORY_TRACT | 2 refills | Status: DC

## 2021-10-28 NOTE — Patient Instructions (Addendum)
Mild intermittent asthma - Doing well since starting Fasenra injections.  Continue maintenance dosing every 8 weeks. - Lung function is improved - Asthma action plan (if having an asthma flare or respiratory illness like a cold): use  Symbicort 160/4.58mg two puffs twice daily with spacer until symptoms have resolved - Prior to physical activity: albuterol 2 puffs 10-15 minutes before physical activity. - Rescue medications: albuterol 2 puffs every 4-6 hours as needed  - Asthma control goals:  * Full participation in all desired activities (may need albuterol before activity) * Albuterol use two time or less a week on average (not counting use with activity) * Cough interfering with sleep two time or less a month * Oral steroids no more than once a year * No hospitalizations  Seasonal and perennial allergic rhinitis (indoor molds, outdoor molds, dust mites and dog) - Continue with: Zyrtec (cetirizine) '10mg'$  tablet once daily and Flonase (fluticasone) one spray per nostril daily   Follow-up in 6-12 months or sooner if needed

## 2021-10-28 NOTE — Telephone Encounter (Signed)
Simonne Martinet,   I accidentally refunded the patients $30 copay today trying to do another patient's. I called and left a voicemail for the patient. I will try calling him again tomorrow.   Thanks

## 2021-10-28 NOTE — Progress Notes (Signed)
Follow-up Note  RE: TAYLAN MAREZ MRN: [id number] DOB: 1936/02/25 Date of Office Visit: 10/28/2021   History of present illness: Ryan Hanson is a 86 y.o. male presenting today for follow-up of asthma and allergic rhinitis.  He was last seen in the office on 04/20/2019 by Dr. Ernst Bowler. Since his last visit he was started on Fasenra 05/20/2019.  He has continued to come for his Berna Bue now at 8-week intervals.  He states his asthma and allergy symptoms have been doing quite well.  He thinks he the Berna Bue has helped a lot.  He does not report any daytime or nighttime symptoms.  He has not had any courses of prednisone for asthma since starting on Fasenra.  He is doing very well.  He states at this time he is not using any controller medications.  He previously was using Symbicort.  He does not recall the last time he needed to use his albuterol inhaler.  He states he did have a cold once last year where he remembers using Mucinex for this. He states he has had not allergy symptoms yet this year.  He states the fall/when the leaves drop is a trigger for him as well as dust.  He will use Zyrtec when needed and it still is helpful.  He also has Flonase at home but he states he will use if he has congestion.  Review of systems in the past 4 weeks: Review of Systems  Constitutional: Negative.   HENT: Negative.    Eyes: Negative.   Respiratory: Negative.    Cardiovascular: Negative.   Musculoskeletal: Negative.   Skin: Negative.   Allergic/Immunologic: Negative.   Neurological: Negative.      All other systems negative unless noted above in HPI  Past medical/social/surgical/family history have been reviewed and are unchanged unless specifically indicated below.  No changes  Medication List: Current Outpatient Medications  Medication Sig Dispense Refill   acetaminophen (TYLENOL) 500 MG tablet Take 1,000 mg by mouth daily.     atorvastatin (LIPITOR) 20 MG tablet Take 0.5 tablets (10  mg total) by mouth daily. 90 tablet 1   Benralizumab (FASENRA) 30 MG/ML SOSY Inject 1 mL (30 mg total) into the skin every 8 (eight) weeks. 1 mL 6   cetirizine (ZYRTEC) 10 MG tablet Take 10 mg by mouth daily.     meclizine (ANTIVERT) 25 MG tablet Take 1 tablet (25 mg total) by mouth 2 (two) times daily as needed for dizziness. 30 tablet 0   albuterol (VENTOLIN HFA) 108 (90 Base) MCG/ACT inhaler Inhale 2 puffs into the lungs every 6 (six) hours as needed for wheezing or shortness of breath. (Patient not taking: Reported on 10/28/2021) 8 g 2   Current Facility-Administered Medications  Medication Dose Route Frequency Provider Last Rate Last Admin   Benralizumab SOSY 30 mg  30 mg Subcutaneous Q8 Toney Reil, MD   30 mg at 08/30/21 1937     Known medication allergies: Allergies  Allergen Reactions   Ceftin [Cefuroxime Axetil]      Physical examination: Blood pressure 128/70, pulse 71, temperature 98.7 F (37.1 C), resp. rate 18, height '5\' 7"'$  (1.702 m), weight 184 lb 6 oz (83.6 kg), SpO2 94 %.  General: Alert, interactive, in no acute distress. HEENT: PERRLA, TMs pearly gray, turbinates non-edematous without discharge, post-pharynx non erythematous. Neck: Supple without lymphadenopathy. Lungs: Clear to auscultation without wheezing, rhonchi or rales. {no increased work of breathing. CV: Normal S1, S2 without murmurs.  Abdomen: Nondistended, nontender. Skin: Warm and dry, without lesions or rashes. Extremities:  No clubbing, cyanosis or edema. Neuro:   Grossly intact.  Diagnositics/Labs:  Spirometry: FEV1: 1.73 L 69%, FVC: 2.4 L 71% predicted.  This is a bit of an improved study from his previous in 2021.  Assessment and plan:   Moderate persistent asthma - Doing well since starting Fasenra injections.  Continue maintenance dosing every 8 weeks. - Lung function is improved - Asthma action plan (if having an asthma flare or respiratory illness like a cold): use   Symbicort 160/4.28mg two puffs twice daily with spacer until symptoms have resolved - Prior to physical activity: albuterol 2 puffs 10-15 minutes before physical activity. - Rescue medications: albuterol 2 puffs every 4-6 hours as needed  - Asthma control goals:  * Full participation in all desired activities (may need albuterol before activity) * Albuterol use two time or less a week on average (not counting use with activity) * Cough interfering with sleep two time or less a month * Oral steroids no more than once a year * No hospitalizations  Seasonal and perennial allergic rhinitis (indoor molds, outdoor molds, dust mites and dog) - Continue with: Zyrtec (cetirizine) '10mg'$  tablet once daily and Flonase (fluticasone) one spray per nostril daily   Follow-up in 6-12 months or sooner if needed  I appreciate the opportunity to take part in Andre's care. Please do not hesitate to contact me with questions.  Sincerely,   SPrudy Feeler MD Allergy/[facility]

## 2021-11-11 ENCOUNTER — Other Ambulatory Visit: Payer: Self-pay | Admitting: *Deleted

## 2021-11-11 ENCOUNTER — Telehealth: Payer: Self-pay

## 2021-11-11 DIAGNOSIS — E785 Hyperlipidemia, unspecified: Secondary | ICD-10-CM

## 2021-11-11 MED ORDER — ATORVASTATIN CALCIUM 20 MG PO TABS: 10.0000 mg | ORAL_TABLET | Freq: Every day | ORAL | 1 refills | Status: DC

## 2021-11-11 NOTE — Telephone Encounter (Signed)
Medication sent to pharmacy  

## 2021-11-11 NOTE — Telephone Encounter (Signed)
Patient came in to office has ran out of his medicine need refills  atorvastatin (LIPITOR) 20 MG tablet [[id number]]    Pharmacy: [facility] [address] Linna Hoff

## 2021-11-26 ENCOUNTER — Other Ambulatory Visit (HOSPITAL_COMMUNITY): Payer: Self-pay | Admitting: Cardiology

## 2021-11-26 DIAGNOSIS — I350 Nonrheumatic aortic (valve) stenosis: Secondary | ICD-10-CM

## 2021-12-11 ENCOUNTER — Telehealth (HOSPITAL_COMMUNITY): Payer: Self-pay | Admitting: *Deleted

## 2021-12-11 NOTE — Telephone Encounter (Signed)
Reaching out to patient to offer assistance regarding upcoming cardiac imaging study; pt verbalizes understanding of appt date/time, parking situation and where to check in, and verified current allergies; name and call back number provided for further questions should they arise  Gordy Clement RN Navigator Cardiac [place] [facility] [phone] office [phone] cell  Patient aware to arrive at 2:30pm.

## 2021-12-11 NOTE — Telephone Encounter (Signed)
Attempted to call patient regarding upcoming cardiac CT appointment. °Left message on voicemail with name and callback number ° °Carnella Fryman RN Navigator Cardiac Imaging °[facility] °[phone] Office °[phone] Cell ° °

## 2021-12-12 ENCOUNTER — Ambulatory Visit (HOSPITAL_COMMUNITY)
Admission: RE | Admit: 2021-12-12 | Discharge: 2021-12-12 | Disposition: A | Discharge status: Home / Self Care | Payer: No Typology Code available for payment source | Source: Ambulatory Visit | Attending: Cardiology | Admitting: Cardiology

## 2021-12-12 VITALS — BP 149/62 | HR 69 | Resp 18

## 2021-12-12 DIAGNOSIS — Z01812 Encounter for preprocedural laboratory examination: Secondary | ICD-10-CM | POA: Diagnosis present

## 2021-12-12 DIAGNOSIS — I350 Nonrheumatic aortic (valve) stenosis: Secondary | ICD-10-CM

## 2021-12-12 LAB — POCT I-STAT CREATININE: Creatinine, Ser: 1.4 mg/dL — ABNORMAL HIGH (ref 0.61–1.24)

## 2021-12-12 MED ORDER — IOHEXOL 350 MG/ML SOLN
100.0000 mL | Freq: Once | INTRAVENOUS | Status: AC | PRN
  Administered 2021-12-12: 100 mL via INTRAVENOUS

## 2021-12-12 NOTE — Progress Notes (Signed)
Creat istat: 1.4

## 2021-12-23 ENCOUNTER — Ambulatory Visit (INDEPENDENT_AMBULATORY_CARE_PROVIDER_SITE_OTHER): Payer: Medicare Other | Admitting: *Deleted

## 2021-12-23 DIAGNOSIS — J4550 Severe persistent asthma, uncomplicated: Secondary | ICD-10-CM | POA: Diagnosis not present

## 2022-02-17 ENCOUNTER — Ambulatory Visit (INDEPENDENT_AMBULATORY_CARE_PROVIDER_SITE_OTHER): Payer: Medicare Other

## 2022-02-17 DIAGNOSIS — J4550 Severe persistent asthma, uncomplicated: Secondary | ICD-10-CM | POA: Diagnosis not present

## 2022-04-03 ENCOUNTER — Other Ambulatory Visit: Payer: Self-pay | Admitting: *Deleted

## 2022-04-03 MED ORDER — FASENRA 30 MG/ML ~~LOC~~ SOSY: 30.0000 mg | PREFILLED_SYRINGE | SUBCUTANEOUS | 6 refills | Status: DC

## 2022-04-03 NOTE — Addendum Note (Signed)
Addended by: Carin Hock on: 04/03/2022 09:18 AM   Modules accepted: Orders

## 2022-04-14 ENCOUNTER — Ambulatory Visit (INDEPENDENT_AMBULATORY_CARE_PROVIDER_SITE_OTHER): Payer: Medicare Other

## 2022-04-14 ENCOUNTER — Encounter: Payer: Self-pay | Admitting: Internal Medicine

## 2022-04-14 ENCOUNTER — Ambulatory Visit (INDEPENDENT_AMBULATORY_CARE_PROVIDER_SITE_OTHER): Payer: Medicare Other | Admitting: Internal Medicine

## 2022-04-14 VITALS — BP 146/70 | HR 77 | Ht 67.0 in | Wt 181.6 lb

## 2022-04-14 DIAGNOSIS — R7303 Prediabetes: Secondary | ICD-10-CM

## 2022-04-14 DIAGNOSIS — Z0001 Encounter for general adult medical examination with abnormal findings: Secondary | ICD-10-CM

## 2022-04-14 DIAGNOSIS — E782 Mixed hyperlipidemia: Secondary | ICD-10-CM | POA: Diagnosis not present

## 2022-04-14 DIAGNOSIS — I350 Nonrheumatic aortic (valve) stenosis: Secondary | ICD-10-CM

## 2022-04-14 DIAGNOSIS — E785 Hyperlipidemia, unspecified: Secondary | ICD-10-CM

## 2022-04-14 DIAGNOSIS — J4550 Severe persistent asthma, uncomplicated: Secondary | ICD-10-CM

## 2022-04-14 DIAGNOSIS — R030 Elevated blood-pressure reading, without diagnosis of hypertension: Secondary | ICD-10-CM

## 2022-04-14 DIAGNOSIS — N1831 Chronic kidney disease, stage 3a: Secondary | ICD-10-CM

## 2022-04-14 NOTE — Patient Instructions (Signed)
Please continue taking medications as prescribed.  Please continue to follow low salt diet and ambulate as tolerated. 

## 2022-04-16 DIAGNOSIS — R030 Elevated blood-pressure reading, without diagnosis of hypertension: Secondary | ICD-10-CM | POA: Insufficient documentation

## 2022-04-16 DIAGNOSIS — N1831 Chronic kidney disease, stage 3a: Secondary | ICD-10-CM | POA: Insufficient documentation

## 2022-04-16 LAB — LIPID PANEL
Chol/HDL Ratio: 2.5 ratio (ref 0.0–5.0)
Cholesterol, Total: 157 mg/dL (ref 100–199)
HDL: 63 mg/dL (ref >39)
LDL Chol Calc (NIH): 77 mg/dL (ref 0–99)
Triglycerides: 89 mg/dL (ref 0–149)
VLDL Cholesterol Cal: 17 mg/dL (ref 5–40)

## 2022-04-16 LAB — CMP14+EGFR
ALT: 16 IU/L (ref 0–44)
AST: 20 IU/L (ref 0–40)
Albumin/Globulin Ratio: 2.2 (ref 1.2–2.2)
Albumin: 4.6 g/dL (ref 3.7–4.7)
Alkaline Phosphatase: 121 IU/L (ref 44–121)
BUN/Creatinine Ratio: 12 (ref 10–24)
BUN: 14 mg/dL (ref 8–27)
Bilirubin Total: 0.5 mg/dL (ref 0.0–1.2)
CO2: 23 mmol/L (ref 20–29)
Calcium: 9.3 mg/dL (ref 8.6–10.2)
Chloride: 102 mmol/L (ref 96–106)
Creatinine, Ser: 1.14 mg/dL (ref 0.76–1.27)
Globulin, Total: 2.1 g/dL (ref 1.5–4.5)
Glucose: 84 mg/dL (ref 70–99)
Potassium: 5 mmol/L (ref 3.5–5.2)
Sodium: 141 mmol/L (ref 134–144)
Total Protein: 6.7 g/dL (ref 6.0–8.5)
eGFR: 63 mL/min/{1.73_m2} (ref >59)

## 2022-04-16 LAB — HEMOGLOBIN A1C
Est. average glucose Bld gHb Est-mCnc: 114 mg/dL
Hgb A1c MFr Bld: 5.6 % (ref 4.8–5.6)

## 2022-04-16 LAB — VITAMIN D 25 HYDROXY (VIT D DEFICIENCY, FRACTURES): Vit D, 25-Hydroxy: 10.5 ng/mL — ABNORMAL LOW (ref 30.0–100.0)

## 2022-04-16 LAB — MICROALBUMIN / CREATININE URINE RATIO
Creatinine, Urine: 56.6 mg/dL
Microalb/Creat Ratio: <5 mg/g creat (ref 0–29)
Microalbumin, Urine: <3 ug/mL

## 2022-04-16 MED ORDER — ATORVASTATIN CALCIUM 10 MG PO TABS: 10.0000 mg | ORAL_TABLET | Freq: Every day | ORAL | 3 refills | Status: DC

## 2022-04-16 NOTE — Assessment & Plan Note (Signed)
Has systolic murmur, although Echo showed only trace AS. Followed by Cardiology Asymptomatic currently

## 2022-04-16 NOTE — Assessment & Plan Note (Signed)
On Lipitor Lipid profile reviewed

## 2022-04-16 NOTE — Assessment & Plan Note (Signed)

## 2022-04-16 NOTE — Assessment & Plan Note (Addendum)
Lab Results  Component Value Date   HGBA1C 5.6 04/14/2022   Continue to follow low carb diet for now

## 2022-04-16 NOTE — Assessment & Plan Note (Signed)
BP Readings from Last 1 Encounters:  04/14/22 (!) 146/70   Advised DASH diet and moderate exercise/walking, at least 150 mins/week Recheck after 2 months If persistently elevated, plan to start ARB

## 2022-04-16 NOTE — Assessment & Plan Note (Signed)
Followed by Allergy clinic Refilled Albuterol, needs to use PRN for dyspnea On Fasenra

## 2022-04-16 NOTE — Assessment & Plan Note (Addendum)
Last BMP reviewed, at [facility] - GFR ranges around 55 On Jardiance Maintain adequate hydration Avoid nephrotoxic agents

## 2022-04-16 NOTE — Progress Notes (Addendum)
Established Patient Office Visit  Subjective:  Patient ID: Ryan Hanson, male    DOB: 07/06/1935  Age: 87 y.o. MRN: [id number]  CC:  Chief Complaint  Patient presents with   Annual Exam    HPI Ryan Hanson is a 87 y.o. male with past medical history of asthma and HLD who presents for annual physical.  He has been doing well overall.  His BP was elevated today. He reports his BP being wnl at home.  He denies any headache, dizziness, chest pain, dyspnea or palpitations.  He reports that he went to the gym before coming here and attributes it to his elevated BP.  [facility]  He recently had blood tests in 11/23, which showed GFR ~55.  He currently denies any dysuria, hematuria or urinary hesitance or resistance.  He has history of AS, but currently is euvolemic.  He is up-to-date with pneumococcal and Shingrix vaccines.  Blood test where reviewed and discussed with the patient in detail.   Past Medical History:  Diagnosis Date   Allergy    Angio-edema    Asbestos exposure    Cataract    Chronic cough    Dyspnea    chronic   Exposure to other animate mechanical forces, initial encounter    Heart murmur    History of kidney stones    Hyperlipidemia    Hyperlipidemia, unspecified    Localized swelling, mass and lump, right lower limb    Moderate persistent asthma without complication 03/23/2019   OSA (obstructive sleep apnea) 03/10/2019   Recurrent upper respiratory infection (URI)    Swelling of throat 02/24/2019   Unspecified chronic bronchitis (HCC)     Past Surgical History:  Procedure Laterality Date   CATARACT EXTRACTION W/PHACO Right 03/19/2015   Procedure: CATARACT EXTRACTION PHACO AND INTRAOCULAR LENS PLACEMENT; CDE:  9.70;  Surgeon: Susa Simmonds, MD;  Location: [facility];  Service: Ophthalmology;  Laterality: Right;   CATARACT EXTRACTION W/PHACO Left 03/16/2017   Procedure: CATARACT EXTRACTION PHACO AND INTRAOCULAR LENS PLACEMENT (IOC);   Surgeon: Gemma Payor, MD;  Location: [facility];  Service: Ophthalmology;  Laterality: Left;  CDE: 26.41   EYE SURGERY      Family History  Problem Relation Age of Onset   Cancer Mother    COPD Father    Allergic rhinitis Neg Hx    Angioedema Neg Hx    Asthma Neg Hx    Atopy Neg Hx    Eczema Neg Hx    Immunodeficiency Neg Hx    Urticaria Neg Hx     Social History   Socioeconomic History   Marital status: Widowed    Spouse name: Not on file   Number of children: 1   Years of education: Not on file   Highest education level: Not on file  Occupational History    Comment: Retired, Nature conservation officer   Tobacco Use   Smoking status: Former    Packs/day: 2.00    Years: 30.00    Total pack years: 60.00    Types: Cigarettes    Quit date: 03/12/1978    Years since quitting: 44.1   Smokeless tobacco: Never  Vaping Use   Vaping Use: Never used  Substance and Sexual Activity   Alcohol use: No   Drug use: No   Sexual activity: Not Currently    Birth control/protection: None  Other Topics Concern   Not on file  Social History Narrative   Lives  alone    Dog: Celenia- yorkie       Son lives close       Enjoys: helping people with things, gym work       Diet: eats all food groups    Caffeine: decaf    Water: 6-8 cups a day       Wears seat belt   Does not use phone while driving    Smoke Midwife in home, safe area    Social Determinants of Health   Financial Resource Strain: Low Risk  (09/18/2021)   Overall Financial Resource Strain (CARDIA)    Difficulty of Paying Living Expenses: Not hard at all  Food Insecurity: No Food Insecurity (09/18/2021)   Hunger Vital Sign    Worried About Running Out of Food in the Last Year: Never true    [place] in the Last Year: Never true  Transportation Needs: No Transportation Needs (09/18/2021)   PRAPARE - Hydrologist (Medical): No    Lack of Transportation  (Non-Medical): No  Physical Activity: Sufficiently Active (09/18/2021)   Exercise Vital Sign    Days of Exercise per Week: 7 days    Minutes of Exercise per Session: 150+ min  Stress: No Stress Concern Present (09/18/2021)   [place]    Feeling of Stress : Not at all  Social Connections: Moderately Isolated (09/18/2021)   Social Connection and Isolation Panel [NHANES]    Frequency of Communication with Friends and Family: More than three times a week    Frequency of Social Gatherings with Friends and Family: More than three times a week    Attends Religious Services: More than 4 times per year    Active Member of [employer] or Organizations: No    Attends Archivist Meetings: Never    Marital Status: Widowed  Intimate Partner Violence: Not At Risk (09/18/2021)   Humiliation, Afraid, Rape, and Kick questionnaire    Fear of Current or Ex-Partner: No    Emotionally Abused: No    Physically Abused: No    Sexually Abused: No    Outpatient Medications Prior to Visit  Medication Sig Dispense Refill   empagliflozin (JARDIANCE) 25 MG TABS tablet Take 12.5 mg by mouth daily.     acetaminophen (TYLENOL) 500 MG tablet Take 1,000 mg by mouth daily.     albuterol (VENTOLIN HFA) 108 (90 Base) MCG/ACT inhaler Inhale 2 puffs into the lungs every 6 (six) hours as needed for wheezing or shortness of breath. 18 g 2   Benralizumab (FASENRA) 30 MG/ML SOSY Inject 1 mL (30 mg total) into the skin every 8 (eight) weeks. 1 mL 6   budesonide-formoterol (SYMBICORT) 160-4.5 MCG/ACT inhaler Inhale 2 puffs into the lungs 2 (two) times daily. 10.2 g 2   cetirizine (ZYRTEC) 10 MG tablet Take 10 mg by mouth daily.     meclizine (ANTIVERT) 25 MG tablet Take 1 tablet (25 mg total) by mouth 2 (two) times daily as needed for dizziness. 30 tablet 0   atorvastatin (LIPITOR) 20 MG tablet Take 0.5 tablets (10 mg total) by mouth daily. 90 tablet 1    Facility-Administered Medications Prior to Visit  Medication Dose Route Frequency Provider Last Rate Last Admin   Benralizumab SOSY 30 mg  30 mg Subcutaneous Q8 Toney Reil, MD   30 mg at 04/14/22 [id number]    Allergies  Allergen Reactions   Ceftin [Cefuroxime Axetil]     ROS Review of Systems  Constitutional:  Negative for chills and fever.  HENT:  Positive for hearing loss. Negative for congestion and sore throat.   Eyes:  Negative for pain and discharge.  Respiratory:  Negative for cough and shortness of breath.   Cardiovascular:  Positive for leg swelling. Negative for chest pain and palpitations.  Gastrointestinal:  Negative for diarrhea, nausea and vomiting.  Endocrine: Negative for polydipsia and polyuria.  Genitourinary:  Negative for dysuria and hematuria.  Musculoskeletal:  Negative for neck pain and neck stiffness.  Skin:  Negative for rash.  Neurological:  Negative for dizziness, weakness, numbness and headaches.  Psychiatric/Behavioral:  Negative for agitation and behavioral problems.       Objective:    Physical Exam Vitals reviewed.  Constitutional:      General: He is not in acute distress.    Appearance: He is not diaphoretic.  HENT:     Head: Normocephalic and atraumatic.     Nose: Nose normal.     Mouth/Throat:     Mouth: Mucous membranes are moist.  Eyes:     General: No scleral icterus.    Extraocular Movements: Extraocular movements intact.  Cardiovascular:     Rate and Rhythm: Normal rate and regular rhythm.     Pulses: Normal pulses.     Heart sounds: Murmur (Over right and left sternal border) heard.  Pulmonary:     Breath sounds: Normal breath sounds. No wheezing or rales.  Abdominal:     Palpations: Abdomen is soft.     Tenderness: There is no abdominal tenderness.  Musculoskeletal:     Cervical back: Neck supple. No tenderness.     Right lower leg: No edema.     Left lower leg: No edema.  Skin:    General: Skin is  warm.     Findings: No rash.  Neurological:     General: No focal deficit present.     Mental Status: He is alert and oriented to person, place, and time.     Cranial Nerves: No cranial nerve deficit.     Sensory: No sensory deficit.     Motor: No weakness.  Psychiatric:        Mood and Affect: Mood normal.        Behavior: Behavior normal.     BP (!) 146/70 (BP Location: Left Arm)   Pulse 77   Ht 5' 7"$  (1.702 m)   Wt 181 lb 9.6 oz (82.4 kg)   SpO2 96%   BMI 28.44 kg/m  Wt Readings from Last 3 Encounters:  04/14/22 181 lb 9.6 oz (82.4 kg)  10/28/21 184 lb 6 oz (83.6 kg)  09/23/21 182 lb 3.2 oz (82.6 kg)    Lab Results  Component Value Date   TSH 1.99 11/10/2018   Lab Results  Component Value Date   WBC 7.1 04/05/2021   HGB 14.9 04/05/2021   HCT 43.5 04/05/2021   MCV 90 04/05/2021   PLT 234 04/05/2021   Lab Results  Component Value Date   NA 141 04/14/2022   K 5.0 04/14/2022   CO2 23 04/14/2022   GLUCOSE 84 04/14/2022   BUN 14 04/14/2022   CREATININE 1.14 04/14/2022   BILITOT 0.5 04/14/2022   ALKPHOS 121 04/14/2022   AST 20 04/14/2022   ALT 16 04/14/2022   PROT 6.7 04/14/2022   ALBUMIN 4.6 04/14/2022   CALCIUM 9.3 04/14/2022   ANIONGAP  8 03/13/2015   EGFR 63 04/14/2022   Lab Results  Component Value Date   CHOL 157 04/14/2022   Lab Results  Component Value Date   HDL 63 04/14/2022   Lab Results  Component Value Date   LDLCALC 77 04/14/2022   Lab Results  Component Value Date   TRIG 89 04/14/2022   Lab Results  Component Value Date   CHOLHDL 2.5 04/14/2022   Lab Results  Component Value Date   HGBA1C 5.6 04/14/2022      Assessment & Plan:   Problem List Items Addressed This Visit       Cardiovascular and Mediastinum   Aortic stenosis    Has systolic murmur, although Echo showed only trace AS. Followed by Cardiology Asymptomatic currently      Relevant Medications   atorvastatin (LIPITOR) 10 MG tablet     Respiratory    Severe persistent asthma without complication    Followed by Allergy clinic Refilled Albuterol, needs to use PRN for dyspnea On Fasenra        Genitourinary   Stage 3a chronic kidney disease ([place])    Last BMP reviewed, at [place] - GFR ranges around 55 On Jardiance Maintain adequate hydration Avoid nephrotoxic agents      Relevant Orders   CMP14+EGFR (Completed)   Urine Microalbumin w/creat. ratio (Completed)   Vitamin D (25 hydroxy) (Completed)     Other   Hyperlipidemia    On Lipitor Lipid profile reviewed      Relevant Medications   atorvastatin (LIPITOR) 10 MG tablet   Other Relevant Orders   Lipid Profile (Completed)   Encounter for general adult medical examination with abnormal findings - Primary    Physical exam as documented. Counseling done  re healthy lifestyle involving commitment to 150 minutes exercise per week, heart healthy diet, and attaining healthy weight.The importance of adequate sleep also discussed. Changes in health habits are decided on by the patient with goals and time frames  set for achieving them. Immunization and cancer screening needs are specifically addressed at this visit.      Prediabetes    Lab Results  Component Value Date   HGBA1C 5.6 04/14/2022  Continue to follow low carb diet for now      Relevant Orders   CMP14+EGFR (Completed)   Hemoglobin A1c (Completed)   Elevated BP without diagnosis of hypertension    BP Readings from Last 1 Encounters:  04/14/22 (!) 146/70  Advised DASH diet and moderate exercise/walking, at least 150 mins/week Recheck after 2 months If persistently elevated, plan to start ARB       Meds ordered this encounter  Medications   atorvastatin (LIPITOR) 10 MG tablet    Sig: Take 1 tablet (10 mg total) by mouth daily.    Dispense:  90 tablet    Refill:  3    Follow-up: Return in about 2 months (around 06/13/2022) for CKD and prediabetes.    Lindell Spar, MD

## 2022-04-30 ENCOUNTER — Ambulatory Visit: Payer: Medicare Other | Admitting: Allergy & Immunology

## 2022-05-23 ENCOUNTER — Encounter: Payer: Self-pay | Admitting: Allergy & Immunology

## 2022-05-23 ENCOUNTER — Other Ambulatory Visit: Payer: Self-pay

## 2022-05-23 ENCOUNTER — Ambulatory Visit: Payer: Medicare Other | Admitting: Allergy & Immunology

## 2022-05-23 VITALS — BP 144/83 | HR 68 | Temp 98.0°F | Resp 16 | Ht 67.0 in | Wt 181.8 lb

## 2022-05-23 DIAGNOSIS — J3089 Other allergic rhinitis: Secondary | ICD-10-CM

## 2022-05-23 DIAGNOSIS — J4540 Moderate persistent asthma, uncomplicated: Secondary | ICD-10-CM

## 2022-05-23 NOTE — Progress Notes (Signed)
FOLLOW UP  Date of Service/Encounter:  05/23/22   Assessment:   Moderate persistent asthma without complication   Seasonal and perennial allergic rhinitis (indoor molds, outdoor molds, dust mites and dog)   Steroid dependence - resolved with the addition of the Fasenra   Elevated absolute eosinophil count of 800 (September 2019) - with normal ANCAs, tryptase, CMP, and troponin  Aortic valve stenosis - followed by cardiology at [facility]    Plan/Recommendations:    Moderate persistent asthma without complication - Lung function looks stable and is much better than when I first met you.  - Continue with Berna Bue every 2 months.  - Continue with Symbicort and albuterol as needed. - You are in a good spot for sure!   2. Seasonal and perennial allergic rhinitis (indoor molds, outdoor molds, dust mites and dog) - Continue with: Zyrtec (cetirizine) 10mg  tablet once daily and Flonase (fluticasone) one spray per nostril daily   3. Return in about 1 year (around 05/23/2023).  Subjective:   Ryan Hanson is a 87 y.o. male presenting today for follow up of  Chief Complaint  Patient presents with   Asthma    No issues     Ryan Hanson has a history of the following: Patient Active Problem List   Diagnosis Date Noted   Stage 3a chronic kidney disease ([place]) 04/16/2022   Elevated BP without diagnosis of hypertension 04/16/2022   Heart murmur 09/23/2021   Low back pain 09/23/2021   Vertigo 09/23/2021   Cataract [id number]   Diastolic dysfunction [id number]   Dyspnea on exertion 04/10/2021   Left ventricular hypertrophy 04/10/2021   Sensorineural hearing loss, bilateral 04/10/2021   Vitamin D deficiency 04/10/2021   Solitary pulmonary nodule 04/10/2021   Encounter for general adult medical examination with abnormal findings 04/10/2021   Prediabetes 04/10/2021   Aortic stenosis 04/10/2020   Severe persistent asthma without complication [id number]   Seasonal and  perennial allergic rhinitis 03/23/2019   Asbestos exposure 03/10/2019   Hyperlipidemia    Localized swelling, mass and lump, right lower limb     History obtained from: chart review and patient.  Ryan Hanson is a 87 y.o. male presenting for a follow up visit.  He was last seen in August 2023 by Dr. Nelva Bush.  At that time, he was doing very well since starting [place].  He continues with Symbicort added during flares and albuterol as needed.  For his rhinitis, we continue with Zyrtec and Flonase as needed.  He was on chronic steroids with the first met him on Medrol 2 mg daily.  Since last visit, he has done very well.   Asthma/Respiratory Symptom History: He has been able to get off of the steroids. He has been off for two years or more. He can tell when the other one wears off. He estimates that he suffers for around 3 days before the next one is do. He had previously been on steroids for years.  He has Symbicort to use if needed but ne never noticed a difference. He has albuterol to use as needed. He is not having any nighttime coughing or wheezing.   He goes to see Cardiology every 4 months at the [place]. He gets an echocardiogram for aortic stenosis. He also does an annual cardiac CT. He goes to [place] location and cannot say enough nice things about them.   Allergic Rhinitis Symptom History: He has not had any sinus infections this year yet.  He uses the generic cetirizine  daily throughout the year. Otherwise he does not use any allergy medications. He has not been on allergy shots at all.   Otherwise, there have been no changes to his past medical history, surgical history, family history, or social history.    Review of Systems  Constitutional: Negative.  Negative for fever, malaise/fatigue and weight loss.  HENT: Negative.  Negative for congestion, ear discharge and ear pain.   Eyes:  Negative for pain, discharge and redness.  Respiratory:  Negative for cough, sputum production,  shortness of breath and wheezing.   Cardiovascular: Negative.  Negative for chest pain and palpitations.  Gastrointestinal:  Negative for abdominal pain and heartburn.  Skin: Negative.  Negative for itching and rash.  Neurological:  Negative for dizziness and headaches.  Endo/Heme/Allergies:  Negative for environmental allergies. Does not bruise/bleed easily.       Objective:   Blood pressure (!) 144/83, pulse 68, temperature 98 F (36.7 C), resp. rate 16, height 5\' 7"  (1.702 m), weight 181 lb 12.8 oz (82.5 kg), SpO2 93 %. Body mass index is 28.47 kg/m.    Physical Exam Constitutional:      Appearance: He is well-developed.     Comments: Lovely and talkative.   HENT:     Head: Normocephalic and atraumatic.     Right Ear: Tympanic membrane, ear canal and external ear normal.     Left Ear: Tympanic membrane, ear canal and external ear normal.     Nose: No nasal deformity, septal deviation, mucosal edema or rhinorrhea.     Right Turbinates: Enlarged, swollen and pale.     Left Turbinates: Enlarged, swollen and pale.     Right Sinus: No maxillary sinus tenderness or frontal sinus tenderness.     Left Sinus: No maxillary sinus tenderness or frontal sinus tenderness.     Mouth/Throat:     Mouth: Mucous membranes are not pale and not dry.     Pharynx: Uvula midline.  Eyes:     General: Lids are normal. No allergic shiner.       Right eye: No discharge.        Left eye: No discharge.     Conjunctiva/sclera: Conjunctivae normal.     Right eye: Right conjunctiva is not injected. No chemosis.    Left eye: Left conjunctiva is not injected. No chemosis.    Pupils: Pupils are equal, round, and reactive to light.  Cardiovascular:     Rate and Rhythm: Normal rate and regular rhythm.     Heart sounds: Normal heart sounds.  Pulmonary:     Effort: Pulmonary effort is normal. No tachypnea, accessory muscle usage or respiratory distress.     Breath sounds: Normal breath sounds. No  wheezing, rhonchi or rales.  Chest:     Chest wall: No tenderness.  Lymphadenopathy:     Cervical: No cervical adenopathy.  Skin:    Coloration: Skin is not pale.     Findings: No abrasion, erythema, petechiae or rash. Rash is not papular, urticarial or vesicular.  Neurological:     Mental Status: He is alert.  Psychiatric:        Behavior: Behavior is cooperative.      Diagnostic studies:    Spirometry: results abnormal (FEV1: 1.69/68%, FVC: 2.30/68%, FEV1/FVC: 73%).    Spirometry consistent with possible restrictive disease.   Allergy Studies: none        Salvatore Marvel, MD  [facility]

## 2022-05-23 NOTE — Patient Instructions (Addendum)
Mild intermittent asthma - Lung function looks stable and is much better than when I first met you.  - Continue with Berna Bue every 2 months.  - Continue with Symbicort and albuterol as needed. - You are in a good spot for sure!   2. Seasonal and perennial allergic rhinitis (indoor molds, outdoor molds, dust mites and dog) - Continue with: Zyrtec (cetirizine) 10mg  tablet once daily and Flonase (fluticasone) one spray per nostril daily   3. Return in about 1 year (around 05/23/2023).   Please inform us of any Emergency Department visits, hospitalizations, or changes in symptoms. Call us before going to the ED for breathing or allergy symptoms since we might be able to fit you in for a sick visit. Feel free to contact us anytime with any questions, problems, or concerns.  It was a pleasure to see you again today!  Websites that have reliable patient information: 1. American Academy of Asthma, Allergy, and Immunology: www.aaaai.org 2. Food Allergy Research and Education (FARE): foodallergy.org 3. Mothers of Asthmatics: http://www.asthmacommunitynetwork.org 4. American College of Allergy, Asthma, and Immunology: www.acaai.org   COVID-19 Vaccine Information can be found at: ShippingScam.co.uk For questions related to vaccine distribution or appointments, please email vaccine@ [email or web address] or call 515 134 2860.   We realize that you might be concerned about having an allergic reaction to the COVID19 vaccines. To help with that concern, WE ARE OFFERING THE COVID19 VACCINES IN OUR OFFICE! Ask the front desk for dates!     "Like" [place] on Facebook and Instagram for our latest updates!      A healthy democracy works best when [employer] participate! Make sure you are registered to vote! If you have moved or changed any of your contact information, you will need to get this updated before voting!  In some cases, you MAY be able to  register to vote online: [email or web address]

## 2022-06-09 ENCOUNTER — Ambulatory Visit (INDEPENDENT_AMBULATORY_CARE_PROVIDER_SITE_OTHER): Payer: Medicare Other

## 2022-06-09 DIAGNOSIS — J4550 Severe persistent asthma, uncomplicated: Secondary | ICD-10-CM

## 2022-06-13 ENCOUNTER — Encounter: Payer: Self-pay | Admitting: Internal Medicine

## 2022-06-13 ENCOUNTER — Ambulatory Visit (INDEPENDENT_AMBULATORY_CARE_PROVIDER_SITE_OTHER): Payer: Medicare Other | Admitting: Internal Medicine

## 2022-06-13 VITALS — BP 138/70 | HR 75 | Ht 67.0 in | Wt 179.0 lb

## 2022-06-13 DIAGNOSIS — I350 Nonrheumatic aortic (valve) stenosis: Secondary | ICD-10-CM

## 2022-06-13 DIAGNOSIS — I10 Essential (primary) hypertension: Secondary | ICD-10-CM

## 2022-06-13 DIAGNOSIS — N1831 Chronic kidney disease, stage 3a: Secondary | ICD-10-CM

## 2022-06-13 NOTE — Assessment & Plan Note (Signed)
[facility] - GFR ranges around 55 On Jardiance Maintain adequate hydration Avoid nephrotoxic agents 

## 2022-06-13 NOTE — Progress Notes (Unsigned)
Established Patient Office Visit  Subjective:  Patient ID: Ryan Hanson, male    DOB: 10/26/1935  Age: 87 y.o. MRN: [id number]  CC:  Chief Complaint  Patient presents with   Hypertension    Two month follow up    HPI Ryan Hanson is a 87 y.o. male with past medical history of asthma and HLD who presents for f/u of his chronic medical conditions.  HTN and AS: BP is well-controlled. Takes medications regularly. Patient denies headache, dizziness, chest pain, dyspnea or palpitations. He is followed by [facility] for AS. He is euvolemic  currently.  CKD:  He recently had blood tests in 11/23, which showed GFR ~55.  He currently denies any dysuria, hematuria or urinary hesitance or resistance. He is on Jardiance currently.   Past Medical History:  Diagnosis Date   Allergy    Angio-edema    Asbestos exposure    Cataract    Chronic cough    Dyspnea    chronic   Exposure to other animate mechanical forces, initial encounter    Heart murmur    History of kidney stones    Hyperlipidemia    Hyperlipidemia, unspecified    Localized swelling, mass and lump, right lower limb    Moderate persistent asthma without complication 03/23/2019   OSA (obstructive sleep apnea) 03/10/2019   Recurrent upper respiratory infection (URI)    Swelling of throat 02/24/2019   Unspecified chronic bronchitis     Past Surgical History:  Procedure Laterality Date   CATARACT EXTRACTION W/PHACO Right 03/19/2015   Procedure: CATARACT EXTRACTION PHACO AND INTRAOCULAR LENS PLACEMENT; CDE:  9.70;  Surgeon: Susa Simmonds, MD;  Location: [facility];  Service: Ophthalmology;  Laterality: Right;   CATARACT EXTRACTION W/PHACO Left 03/16/2017   Procedure: CATARACT EXTRACTION PHACO AND INTRAOCULAR LENS PLACEMENT (IOC);  Surgeon: Gemma Payor, MD;  Location: [facility];  Service: Ophthalmology;  Laterality: Left;  CDE: 26.41   EYE SURGERY      Family History  Problem Relation Age of Onset   Cancer Mother    COPD  Father    Allergic rhinitis Neg Hx    Angioedema Neg Hx    Asthma Neg Hx    Atopy Neg Hx    Eczema Neg Hx    Immunodeficiency Neg Hx    Urticaria Neg Hx     Social History   Socioeconomic History   Marital status: Widowed    Spouse name: Not on file   Number of children: 1   Years of education: Not on file   Highest education level: 12th grade  Occupational History    Comment: Retired, Nature conservation officer   Tobacco Use   Smoking status: Former    Packs/day: 2.00    Years: 30.00    Additional pack years: 0.00    Total pack years: 60.00    Types: Cigarettes    Quit date: 03/12/1978    Years since quitting: 44.2   Smokeless tobacco: Never  Vaping Use   Vaping Use: Never used  Substance and Sexual Activity   Alcohol use: No   Drug use: No   Sexual activity: Not Currently    Birth control/protection: None  Other Topics Concern   Not on file  Social History Narrative   Lives alone    Dog: Celenia- yorkie       Son lives close       Enjoys: helping people with things, gym work  Diet: eats all food groups    Caffeine: decaf    Water: 6-8 cups a day       Wears seat belt   Does not use phone while driving    Smoke Psychologist, prison and probation services in home, safe area    Social Determinants of Health   Financial Resource Strain: Low Risk  (06/12/2022)   Overall Financial Resource Strain (CARDIA)    Difficulty of Paying Living Expenses: Not hard at all  Food Insecurity: No Food Insecurity (06/12/2022)   Hunger Vital Sign    Worried About Running Out of Food in the Last Year: Never true    Ran Out of Food in the Last Year: Never true  Transportation Needs: No Transportation Needs (06/12/2022)   PRAPARE - Administrator, Civil Service (Medical): No    Lack of Transportation (Non-Medical): No  Physical Activity: Sufficiently Active (06/12/2022)   Exercise Vital Sign    Days of Exercise per Week: 5 days    Minutes of Exercise per Session: 120 min   Stress: No Stress Concern Present (06/12/2022)   [employer] of Occupational Health - Occupational Stress Questionnaire    Feeling of Stress : Only a little  Social Connections: Unknown (06/12/2022)   Social Connection and Isolation Panel [NHANES]    Frequency of Communication with Friends and Family: Once a week    Frequency of Social Gatherings with Friends and Family: More than three times a week    Attends Religious Services: Patient declined    Database administrator or Organizations: No    Attends Banker Meetings: Never    Marital Status: Widowed  Intimate Partner Violence: Not At Risk (09/18/2021)   Humiliation, Afraid, Rape, and Kick questionnaire    Fear of Current or Ex-Partner: No    Emotionally Abused: No    Physically Abused: No    Sexually Abused: No    Outpatient Medications Prior to Visit  Medication Sig Dispense Refill   acetaminophen (TYLENOL) 500 MG tablet Take 1,000 mg by mouth daily.     albuterol (VENTOLIN HFA) 108 (90 Base) MCG/ACT inhaler Inhale 2 puffs into the lungs every 6 (six) hours as needed for wheezing or shortness of breath. 18 g 2   atorvastatin (LIPITOR) 10 MG tablet Take 1 tablet (10 mg total) by mouth daily. 90 tablet 3   Benralizumab (FASENRA) 30 MG/ML SOSY Inject 1 mL (30 mg total) into the skin every 8 (eight) weeks. 1 mL 6   cetirizine (ZYRTEC) 10 MG tablet Take 10 mg by mouth daily.     empagliflozin (JARDIANCE) 25 MG TABS tablet Take 12.5 mg by mouth daily.     meclizine (ANTIVERT) 25 MG tablet Take 1 tablet (25 mg total) by mouth 2 (two) times daily as needed for dizziness. 30 tablet 0   budesonide-formoterol (SYMBICORT) 160-4.5 MCG/ACT inhaler Inhale 2 puffs into the lungs 2 (two) times daily. (Patient not taking: Reported on 05/23/2022) 10.2 g 2   Facility-Administered Medications Prior to Visit  Medication Dose Route Frequency Provider Last Rate Last Admin   Benralizumab SOSY 30 mg  30 mg Subcutaneous Q8 Thomes Dinning, MD   30 mg at 06/09/22 1191    Allergies  Allergen Reactions   Ceftin [Cefuroxime Axetil]     ROS Review of Systems  Constitutional:  Negative for chills and fever.  HENT:  Positive for hearing loss. Negative for congestion and sore throat.  Eyes:  Negative for pain and discharge.  Respiratory:  Negative for cough and shortness of breath.   Cardiovascular:  Positive for leg swelling. Negative for chest pain and palpitations.  Gastrointestinal:  Negative for diarrhea, nausea and vomiting.  Endocrine: Negative for polydipsia and polyuria.  Genitourinary:  Negative for dysuria and hematuria.  Musculoskeletal:  Negative for neck pain and neck stiffness.  Skin:  Negative for rash.  Neurological:  Negative for dizziness, weakness, numbness and headaches.  Psychiatric/Behavioral:  Negative for agitation and behavioral problems.       Objective:    Physical Exam Vitals reviewed.  Constitutional:      General: He is not in acute distress.    Appearance: He is not diaphoretic.  HENT:     Head: Normocephalic and atraumatic.     Nose: Nose normal.     Mouth/Throat:     Mouth: Mucous membranes are moist.  Eyes:     General: No scleral icterus.    Extraocular Movements: Extraocular movements intact.  Cardiovascular:     Rate and Rhythm: Normal rate and regular rhythm.     Pulses: Normal pulses.     Heart sounds: Murmur (Over right and left sternal border) heard.  Pulmonary:     Breath sounds: Normal breath sounds. No wheezing or rales.  Abdominal:     Palpations: Abdomen is soft.     Tenderness: There is no abdominal tenderness.  Musculoskeletal:     Cervical back: Neck supple. No tenderness.     Right lower leg: No edema.     Left lower leg: No edema.  Skin:    General: Skin is warm.     Findings: No rash.  Neurological:     General: No focal deficit present.     Mental Status: He is alert and oriented to person, place, and time.     Sensory: No sensory  deficit.     Motor: No weakness.  Psychiatric:        Mood and Affect: Mood normal.        Behavior: Behavior normal.     BP 138/70 (BP Location: Right Arm)   Pulse 75   Ht  (1.702 m)   Wt 179 lb (81.2 kg)   SpO2 96%   BMI 28.04 kg/m  Wt Readings from Last 3 Encounters:  06/13/22 179 lb (81.2 kg)  05/23/22 181 lb 12.8 oz (82.5 kg)  04/14/22 181 lb 9.6 oz (82.4 kg)    Lab Results  Component Value Date   TSH 1.99 11/10/2018   Lab Results  Component Value Date   WBC 7.1 04/05/2021   HGB 14.9 04/05/2021   HCT 43.5 04/05/2021   MCV 90 04/05/2021   PLT 234 04/05/2021   Lab Results  Component Value Date   NA 141 04/14/2022   K 5.0 04/14/2022   CO2 23 04/14/2022   GLUCOSE 84 04/14/2022   BUN 14 04/14/2022   CREATININE 1.14 04/14/2022   BILITOT 0.5 04/14/2022   ALKPHOS 121 04/14/2022   AST 20 04/14/2022   ALT 16 04/14/2022   PROT 6.7 04/14/2022   ALBUMIN 4.6 04/14/2022   CALCIUM 9.3 04/14/2022   ANIONGAP 8 03/13/2015   EGFR 63 04/14/2022   Lab Results  Component Value Date   CHOL 157 04/14/2022   Lab Results  Component Value Date   HDL 63 04/14/2022   Lab Results  Component Value Date   LDLCALC 77 04/14/2022   Lab Results  Component Value  Date   TRIG 89 04/14/2022   Lab Results  Component Value Date   CHOLHDL 2.5 04/14/2022   Lab Results  Component Value Date   HGBA1C 5.6 04/14/2022      Assessment & Plan:   Problem List Items Addressed This Visit       Cardiovascular and Mediastinum   Aortic stenosis    Has systolic murmur, although Echo showed only trace AS. Followed by Cardiology Asymptomatic currently      Essential hypertension - Primary    BP Readings from Last 1 Encounters:  06/13/22 138/70  Well-controlled with diet modification Counseled for compliance with the medications Advised DASH diet and moderate exercise/walking, at least 150 mins/week         Genitourinary   Stage 3a chronic kidney disease    Last BMP  reviewed, at [place] - GFR ranges around 55 On Jardiance Maintain adequate hydration Avoid nephrotoxic agents       No orders of the defined types were placed in this encounter.   Follow-up: Return in about 6 months (around 12/13/2022) for HTN.    Anabel Halon, MD

## 2022-06-13 NOTE — Patient Instructions (Signed)
Please continue to take medications as prescribed.  Please continue to follow DASH diet and perform moderate exercise/walking at least 150 mins/week. 

## 2022-06-13 NOTE — Assessment & Plan Note (Signed)
Has systolic murmur, although Echo showed only trace AS. Followed by Cardiology Asymptomatic currently 

## 2022-06-14 NOTE — Assessment & Plan Note (Addendum)
BP Readings from Last 1 Encounters:  06/13/22 138/70   Well-controlled with diet modification Counseled for compliance with the medications Advised DASH diet and moderate exercise/walking, at least 150 mins/week

## 2022-08-04 ENCOUNTER — Ambulatory Visit (INDEPENDENT_AMBULATORY_CARE_PROVIDER_SITE_OTHER): Payer: Medicare Other

## 2022-08-04 DIAGNOSIS — J4550 Severe persistent asthma, uncomplicated: Secondary | ICD-10-CM

## 2022-09-29 ENCOUNTER — Ambulatory Visit (INDEPENDENT_AMBULATORY_CARE_PROVIDER_SITE_OTHER): Payer: Medicare Other

## 2022-09-29 DIAGNOSIS — J4550 Severe persistent asthma, uncomplicated: Secondary | ICD-10-CM | POA: Diagnosis not present

## 2022-11-24 ENCOUNTER — Ambulatory Visit (INDEPENDENT_AMBULATORY_CARE_PROVIDER_SITE_OTHER): Payer: Medicare Other

## 2022-11-24 DIAGNOSIS — J4550 Severe persistent asthma, uncomplicated: Secondary | ICD-10-CM

## 2022-12-15 ENCOUNTER — Encounter: Payer: Self-pay | Admitting: Internal Medicine

## 2022-12-15 ENCOUNTER — Ambulatory Visit: Payer: Medicare Other | Admitting: Internal Medicine

## 2022-12-15 VITALS — BP 123/54 | HR 73 | Ht 68.0 in | Wt 177.6 lb

## 2022-12-15 DIAGNOSIS — I350 Nonrheumatic aortic (valve) stenosis: Secondary | ICD-10-CM | POA: Diagnosis not present

## 2022-12-15 DIAGNOSIS — I10 Essential (primary) hypertension: Secondary | ICD-10-CM

## 2022-12-15 DIAGNOSIS — E782 Mixed hyperlipidemia: Secondary | ICD-10-CM

## 2022-12-15 DIAGNOSIS — J4550 Severe persistent asthma, uncomplicated: Secondary | ICD-10-CM | POA: Diagnosis not present

## 2022-12-15 DIAGNOSIS — N1831 Chronic kidney disease, stage 3a: Secondary | ICD-10-CM

## 2022-12-15 DIAGNOSIS — R7303 Prediabetes: Secondary | ICD-10-CM | POA: Diagnosis not present

## 2022-12-15 DIAGNOSIS — Z7984 Long term (current) use of oral hypoglycemic drugs: Secondary | ICD-10-CM

## 2022-12-15 NOTE — Assessment & Plan Note (Signed)
Last BMP reviewed, at [place], GFR: 59 GFR ranges around 55 On Jardiance Maintain adequate hydration Avoid nephrotoxic agents

## 2022-12-15 NOTE — Assessment & Plan Note (Signed)
Has systolic murmur, although Echo showed only trace AS. Followed by Cardiology Asymptomatic currently

## 2022-12-15 NOTE — Patient Instructions (Addendum)
Schedule your Medicare Annual Wellness Visit at checkout.  Please continue to take medications as prescribed.  Please continue to follow low salt diet and perform moderate exercise/walking as tolerated.

## 2022-12-15 NOTE — Progress Notes (Signed)
Established Patient Office Visit  Subjective:  Patient ID: Ryan Hanson, male    DOB: 1935-09-20  Age: 87 y.o. MRN: [id number]  CC:  Chief Complaint  Patient presents with   Hypertension    Six month follow up     HPI Ryan Hanson is a 87 y.o. male with past medical history of asthma and HLD who presents for f/u of his chronic medical conditions.  HTN and AS: BP is well-controlled. Takes medications regularly. Patient denies headache, dizziness, chest pain, dyspnea or palpitations. He is followed by [facility] for AS. He is euvolemic  currently.  CKD:  He recently had blood tests in 05/24, which showed GFR ~59, stays 55-60.  He currently denies any dysuria, hematuria or urinary hesitance or resistance. He is on Jardiance currently.   Past Medical History:  Diagnosis Date   Allergy    Angio-edema    Asbestos exposure    Cataract    Chronic cough    Dyspnea    chronic   Exposure to other animate mechanical forces, initial encounter    Heart murmur    History of kidney stones    Hyperlipidemia    Hyperlipidemia, unspecified    Localized swelling, mass and lump, right lower limb    Moderate persistent asthma without complication 03/23/2019   OSA (obstructive sleep apnea) 03/10/2019   Recurrent upper respiratory infection (URI)    Swelling of throat 02/24/2019   Unspecified chronic bronchitis (HCC)     Past Surgical History:  Procedure Laterality Date   CATARACT EXTRACTION W/PHACO Right 03/19/2015   Procedure: CATARACT EXTRACTION PHACO AND INTRAOCULAR LENS PLACEMENT; CDE:  9.70;  Surgeon: Susa Simmonds, MD;  Location: [facility];  Service: Ophthalmology;  Laterality: Right;   CATARACT EXTRACTION W/PHACO Left 03/16/2017   Procedure: CATARACT EXTRACTION PHACO AND INTRAOCULAR LENS PLACEMENT (IOC);  Surgeon: Gemma Payor, MD;  Location: [facility];  Service: Ophthalmology;  Laterality: Left;  CDE: 26.41   EYE SURGERY      Family History  Problem Relation Age of Onset    Cancer Mother    COPD Father    Allergic rhinitis Neg Hx    Angioedema Neg Hx    Asthma Neg Hx    Atopy Neg Hx    Eczema Neg Hx    Immunodeficiency Neg Hx    Urticaria Neg Hx     Social History   Socioeconomic History   Marital status: Widowed    Spouse name: Not on file   Number of children: 1   Years of education: Not on file   Highest education level: 12th grade  Occupational History    Comment: Retired, Nature conservation officer   Tobacco Use   Smoking status: Former    Current packs/day: 0.00    Average packs/day: 2.0 packs/day for 30.0 years (60.0 ttl pk-yrs)    Types: Cigarettes    Start date: 03/12/1948    Quit date: 03/12/1978    Years since quitting: 44.7   Smokeless tobacco: Never  Vaping Use   Vaping status: Never Used  Substance and Sexual Activity   Alcohol use: No   Drug use: No   Sexual activity: Not Currently    Birth control/protection: None  Other Topics Concern   Not on file  Social History Narrative   Lives alone    Dog: Celenia- yorkie       Son lives close       Enjoys: helping people with things, gym work  Diet: eats all food groups    Caffeine: decaf    Water: 6-8 cups a day       Wears seat belt   Does not use phone while driving    Smoke Psychologist, prison and probation services in home, safe area    Social Determinants of Health   Financial Resource Strain: Low Risk  (06/12/2022)   Overall Financial Resource Strain (CARDIA)    Difficulty of Paying Living Expenses: Not hard at all  Food Insecurity: No Food Insecurity (06/12/2022)   Hunger Vital Sign    Worried About Running Out of Food in the Last Year: Never true    Ran Out of Food in the Last Year: Never true  Transportation Needs: No Transportation Needs (06/12/2022)   PRAPARE - Administrator, Civil Service (Medical): No    Lack of Transportation (Non-Medical): No  Physical Activity: Sufficiently Active (06/12/2022)   Exercise Vital Sign    Days of Exercise per Week: 5  days    Minutes of Exercise per Session: 120 min  Stress: No Stress Concern Present (06/12/2022)   [employer] of Occupational Health - Occupational Stress Questionnaire    Feeling of Stress : Only a little  Social Connections: Unknown (06/12/2022)   Social Connection and Isolation Panel [NHANES]    Frequency of Communication with Friends and Family: Once a week    Frequency of Social Gatherings with Friends and Family: More than three times a week    Attends Religious Services: Patient declined    Database administrator or Organizations: No    Attends Banker Meetings: Not on file    Marital Status: Widowed  Intimate Partner Violence: Not At Risk (09/18/2021)   Humiliation, Afraid, Rape, and Kick questionnaire    Fear of Current or Ex-Partner: No    Emotionally Abused: No    Physically Abused: No    Sexually Abused: No    Outpatient Medications Prior to Visit  Medication Sig Dispense Refill   acetaminophen (TYLENOL) 500 MG tablet Take 1,000 mg by mouth daily.     albuterol (VENTOLIN HFA) 108 (90 Base) MCG/ACT inhaler Inhale 2 puffs into the lungs every 6 (six) hours as needed for wheezing or shortness of breath. 18 g 2   atorvastatin (LIPITOR) 10 MG tablet Take 1 tablet (10 mg total) by mouth daily. 90 tablet 3   Benralizumab (FASENRA) 30 MG/ML SOSY Inject 1 mL (30 mg total) into the skin every 8 (eight) weeks. 1 mL 6   cetirizine (ZYRTEC) 10 MG tablet Take 10 mg by mouth daily.     empagliflozin (JARDIANCE) 25 MG TABS tablet Take 12.5 mg by mouth daily.     meclizine (ANTIVERT) 25 MG tablet Take 1 tablet (25 mg total) by mouth 2 (two) times daily as needed for dizziness. 30 tablet 0   Facility-Administered Medications Prior to Visit  Medication Dose Route Frequency Provider Last Rate Last Admin   Benralizumab SOSY 30 mg  30 mg Subcutaneous Q8 Thomes Dinning, MD   30 mg at 11/24/22 0865    Allergies  Allergen Reactions   Ceftin [Cefuroxime Axetil]      ROS Review of Systems  Constitutional:  Negative for chills and fever.  HENT:  Positive for hearing loss. Negative for congestion and sore throat.   Eyes:  Negative for pain and discharge.  Respiratory:  Negative for cough and shortness of breath.   Cardiovascular:  Negative  for chest pain and palpitations.  Gastrointestinal:  Negative for diarrhea, nausea and vomiting.  Endocrine: Negative for polydipsia and polyuria.  Genitourinary:  Negative for dysuria and hematuria.  Musculoskeletal:  Negative for neck pain and neck stiffness.  Skin:  Negative for rash.  Neurological:  Negative for dizziness, weakness, numbness and headaches.  Psychiatric/Behavioral:  Negative for agitation and behavioral problems.       Objective:    Physical Exam Vitals reviewed.  Constitutional:      General: He is not in acute distress.    Appearance: He is not diaphoretic.  HENT:     Head: Normocephalic and atraumatic.     Nose: Nose normal.     Mouth/Throat:     Mouth: Mucous membranes are moist.  Eyes:     General: No scleral icterus.    Extraocular Movements: Extraocular movements intact.  Cardiovascular:     Rate and Rhythm: Normal rate and regular rhythm.     Pulses: Normal pulses.     Heart sounds: Murmur (Over right and left sternal border) heard.  Pulmonary:     Breath sounds: Normal breath sounds. No wheezing or rales.  Abdominal:     Palpations: Abdomen is soft.     Tenderness: There is no abdominal tenderness.  Musculoskeletal:     Cervical back: Neck supple. No tenderness.     Right lower leg: No edema.     Left lower leg: No edema.  Skin:    General: Skin is warm.     Findings: No rash.  Neurological:     General: No focal deficit present.     Mental Status: He is alert and oriented to person, place, and time.     Sensory: No sensory deficit.     Motor: No weakness.  Psychiatric:        Mood and Affect: Mood normal.        Behavior: Behavior normal.     BP (!)  123/54 (BP Location: Left Arm, Patient Position: Sitting, Cuff Size: Normal)   Pulse 73   Ht 5\' 8"  (1.727 m)   Wt 177 lb 9.6 oz (80.6 kg)   SpO2 96%   BMI 27.00 kg/m  Wt Readings from Last 3 Encounters:  12/15/22 177 lb 9.6 oz (80.6 kg)  06/13/22 179 lb (81.2 kg)  05/23/22 181 lb 12.8 oz (82.5 kg)    Lab Results  Component Value Date   TSH 1.99 11/10/2018   Lab Results  Component Value Date   WBC 7.1 04/05/2021   HGB 14.9 04/05/2021   HCT 43.5 04/05/2021   MCV 90 04/05/2021   PLT 234 04/05/2021   Lab Results  Component Value Date   NA 141 04/14/2022   K 5.0 04/14/2022   CO2 23 04/14/2022   GLUCOSE 84 04/14/2022   BUN 14 04/14/2022   CREATININE 1.14 04/14/2022   BILITOT 0.5 04/14/2022   ALKPHOS 121 04/14/2022   AST 20 04/14/2022   ALT 16 04/14/2022   PROT 6.7 04/14/2022   ALBUMIN 4.6 04/14/2022   CALCIUM 9.3 04/14/2022   ANIONGAP 8 03/13/2015   EGFR 63 04/14/2022   Lab Results  Component Value Date   CHOL 157 04/14/2022   Lab Results  Component Value Date   HDL 63 04/14/2022   Lab Results  Component Value Date   LDLCALC 77 04/14/2022   Lab Results  Component Value Date   TRIG 89 04/14/2022   Lab Results  Component Value Date   CHOLHDL  2.5 04/14/2022   Lab Results  Component Value Date   HGBA1C 5.6 04/14/2022      Assessment & Plan:   Problem List Items Addressed This Visit       Cardiovascular and Mediastinum   Aortic stenosis    Has systolic murmur, although Echo showed only trace AS. Followed by Cardiology Asymptomatic currently      Essential hypertension - Primary    BP Readings from Last 1 Encounters:  12/15/22 (!) 123/54   Well-controlled with diet modification Counseled for compliance with the medications Advised DASH diet and moderate exercise/walking as tolerated        Respiratory   Severe persistent asthma without complication    Followed by Allergy clinic Has Albuterol, needs to use PRN for dyspnea - rarely needs  it On Fasenra        Genitourinary   Stage 3a chronic kidney disease (HCC)    Last BMP reviewed, at [place], GFR: 59 GFR ranges around 55 On Jardiance Maintain adequate hydration Avoid nephrotoxic agents        Other   Hyperlipidemia    On Lipitor Lipid profile reviewed      Prediabetes    Lab Results  Component Value Date   HGBA1C 5.6 04/14/2022   Continue to follow low carb diet for now       Prefers to get flu vaccine at [facility].   No orders of the defined types were placed in this encounter.   Follow-up: Return in about 6 months (around 06/15/2023) for Annual physical.    Anabel Halon, MD

## 2022-12-15 NOTE — Assessment & Plan Note (Addendum)
BP Readings from Last 1 Encounters:  12/15/22 (!) 123/54   Well-controlled with diet modification Counseled for compliance with the medications Advised DASH diet and moderate exercise/walking as tolerated

## 2022-12-15 NOTE — Assessment & Plan Note (Addendum)
Followed by Allergy clinic Has Albuterol, needs to use PRN for dyspnea - rarely needs it On Fasenra

## 2022-12-15 NOTE — Assessment & Plan Note (Signed)
On Lipitor Lipid profile reviewed

## 2022-12-15 NOTE — Assessment & Plan Note (Signed)
Lab Results  Component Value Date   HGBA1C 5.6 04/14/2022   Continue to follow low carb diet for now

## 2023-01-02 LAB — HEMOGLOBIN A1C: Hemoglobin A1C: 5.6

## 2023-01-19 ENCOUNTER — Ambulatory Visit: Payer: Medicare Other

## 2023-01-19 DIAGNOSIS — J4550 Severe persistent asthma, uncomplicated: Secondary | ICD-10-CM | POA: Diagnosis not present

## 2023-02-18 LAB — COMPREHENSIVE METABOLIC PANEL WITH GFR: eGFR: 59

## 2023-02-18 LAB — BASIC METABOLIC PANEL WITH GFR: Creatinine: 1.3 (ref 0.6–1.3)

## 2023-03-05 ENCOUNTER — Other Ambulatory Visit: Payer: Self-pay | Admitting: *Deleted

## 2023-03-05 MED ORDER — FASENRA 30 MG/ML ~~LOC~~ SOSY: 30.0000 mg | PREFILLED_SYRINGE | SUBCUTANEOUS | 6 refills | Status: DC

## 2023-03-16 ENCOUNTER — Ambulatory Visit: Payer: Medicare Other

## 2023-03-16 DIAGNOSIS — J4550 Severe persistent asthma, uncomplicated: Secondary | ICD-10-CM

## 2023-05-08 ENCOUNTER — Other Ambulatory Visit: Payer: Self-pay

## 2023-05-08 ENCOUNTER — Ambulatory Visit: Payer: Medicare Other

## 2023-05-08 ENCOUNTER — Ambulatory Visit: Payer: Medicare Other | Admitting: Allergy & Immunology

## 2023-05-08 ENCOUNTER — Encounter: Payer: Self-pay | Admitting: Allergy & Immunology

## 2023-05-08 VITALS — BP 127/78 | HR 71 | Temp 97.5°F | Ht 68.0 in | Wt 180.0 lb

## 2023-05-08 DIAGNOSIS — J4550 Severe persistent asthma, uncomplicated: Secondary | ICD-10-CM

## 2023-05-08 DIAGNOSIS — J3089 Other allergic rhinitis: Secondary | ICD-10-CM

## 2023-05-08 MED ORDER — ALBUTEROL SULFATE HFA 108 (90 BASE) MCG/ACT IN AERS: 2.0000 | INHALATION_SPRAY | Freq: Four times a day (QID) | RESPIRATORY_TRACT | 2 refills | Status: AC | PRN

## 2023-05-08 MED ORDER — FLUTICASONE PROPIONATE 50 MCG/ACT NA SUSP: 1.0000 | Freq: Every day | NASAL | 11 refills | Status: AC

## 2023-05-08 MED ORDER — CETIRIZINE HCL 10 MG PO TABS: 10.0000 mg | ORAL_TABLET | Freq: Every day | ORAL | 11 refills | Status: AC

## 2023-05-08 NOTE — Patient Instructions (Addendum)
 Mild intermittent asthma - Lung function was actually NORMAL today, which is good news. - I am going to talk to Tammy about that large bill you received.  - This seems odd for sure.  - Definitely do not pay it yet until we get to the bottom of it.  - Continue with Harrington Challenger every 2 months.  - Continue with Symbicort and albuterol as needed.  2. Seasonal and perennial allergic rhinitis (indoor molds, outdoor molds, dust mites and dog) - Continue with: Zyrtec (cetirizine) 10mg  tablet once daily and Flonase (fluticasone) one spray per nostril daily   3. Return in about 1 year (around 05/07/2024). You can have the follow up appointment with Dr. Dellis Anes or a Nurse Practicioner (our Nurse Practitioners are excellent and always have Physician oversight!).    Please inform us of any Emergency Department visits, hospitalizations, or changes in symptoms. Call us before going to the ED for breathing or allergy symptoms since we might be able to fit you in for a sick visit. Feel free to contact us anytime with any questions, problems, or concerns.  It was a pleasure to see you again today!  Websites that have reliable patient information: 1. American Academy of Asthma, Allergy, and Immunology: www.aaaai.org 2. Food Allergy Research and Education (FARE): foodallergy.org 3. Mothers of Asthmatics: http://www.asthmacommunitynetwork.org 4. American College of Allergy, Asthma, and Immunology: www.acaai.org      "Like" [place] on Facebook and Instagram for our latest updates!      A healthy democracy works best when [employer] participate! Make sure you are registered to vote! If you have moved or changed any of your contact information, you will need to get this updated before voting! Scan the QR codes below to learn more!

## 2023-05-08 NOTE — Progress Notes (Signed)
 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 - I am going to talk to Tammy about that large bill you received.  - This seems odd for sure.  - Definitely do not pay it yet until we get to the bottom of it.  - Continue with Harrington Challenger every 2 months.  - Continue with Symbicort and albuterol as needed.  2. Seasonal and perennial allergic rhinitis (indoor molds, outdoor molds, dust mites and dog) - Continue with: Zyrtec (cetirizine) 10mg  tablet once daily and Flonase (fluticasone) one spray per nostril daily   3. Return in about 1 year (around 05/07/2024). You can have the follow up appointment with Dr. Dellis Anes or a Nurse Practicioner (our Nurse Practitioners are excellent and always have Physician oversight!).    Subjective:   Ryan Hanson is a 88 y.o. male presenting today for follow up of  Chief Complaint  Patient presents with   Asthma    DEAVIN FORST has a history of the following: Patient Active Problem List   Diagnosis Date Noted   Essential hypertension 06/13/2022   Stage 3a chronic kidney disease (HCC) 04/16/2022   Low back pain 09/23/2021   Vertigo 09/23/2021   Cataract 04/10/2021   Diastolic dysfunction 04/10/2021   Dyspnea on exertion 04/10/2021   Left ventricular hypertrophy 04/10/2021   Sensorineural hearing loss, bilateral 04/10/2021   Vitamin D deficiency 04/10/2021   Solitary pulmonary nodule 04/10/2021   Encounter for general adult  medical examination with abnormal findings 04/10/2021   Prediabetes 04/10/2021   Aortic stenosis 04/10/2020   Severe persistent asthma without complication 03/23/2019   Seasonal and perennial allergic rhinitis 03/23/2019   Asbestos exposure 03/10/2019   Hyperlipidemia     History obtained from: chart review and patient.  Discussed the use of AI scribe software for clinical note transcription with the patient and/or guardian, who gave verbal consent to proceed.  Ryan Hanson is a 88 y.o. male presenting for a follow up visit.  Ryan Hanson was last seen in March 2024.  At that time, lung function looks stable.  We continue with Harrington Challenger every 2 months as well as Symbicort and albuterol as needed.  Ryan Hanson was doing very well with this regimen.  For his rhinitis, we will continue with Zyrtec as well as Flonase.  Since the last visit, Ryan Hanson has done well.  Asthma/Respiratory Symptom History: Ryan Hanson is currently on Fasenra injections, administered every eight weeks. Since starting the injections, Ryan Hanson has not experienced any wheezing, indicating the treatment's effectiveness. Ryan Hanson recently received a bill for $1,040 for a single Fasenra injection, which is significantly higher than the usual $13-$15 Ryan Hanson has been billed in the past. Ryan Hanson has contacted the billing department for clarification.  Ryan Hanson experiences shortness of breath when walking short distances, such as from his backyard to his house, but not during more strenuous activities like walking three miles. Albuterol has been tried for relief but has not been effective. Ryan Hanson participates in physical activities, including walking and working out three days a  week, and does not experience shortness of breath during these activities.  Ryan Hanson goes to see Cardiology every 4 months at the [place]. Ryan Hanson gets an echocardiogram for aortic stenosis. Latest echocardiogram showed that Ryan Hanson might need a valve replacement. They are looking into May or June, but then Ryan Hanson was told that they re-read his  echocardiogram "read it wrong" and looked a lot better. Ryan Hanson goes to [place] location and cannot say enough nice things about them.   Allergic Rhinitis Symptom History: Ryan Hanson remains on cetirizine. Ryan Hanson has the Flonase to use as needed. Ryan Hanson has not been on antibiotics at all since the last visit.    Otherwise, there have been no changes to his past medical history, surgical history, family history, or social history.    Review of systems otherwise negative other than that mentioned in the HPI.    Objective:   Blood pressure 127/78, pulse 71, temperature (!) 97.5 F (36.4 C), height 5\' 8"  (1.727 m), weight 180 lb (81.6 kg), SpO2 93%. Body mass index is 27.37 kg/m.    Physical Exam Vitals reviewed.  Constitutional:      Appearance: Ryan Hanson is well-developed.     Comments: Lovely and talkative.   HENT:     Head: Normocephalic and atraumatic.     Right Ear: Tympanic membrane, ear canal and external ear normal.     Left Ear: Tympanic membrane, ear canal and external ear normal.     Nose: No nasal deformity, septal deviation, mucosal edema or rhinorrhea.     Right Turbinates: Enlarged, swollen and pale.     Left Turbinates: Enlarged, swollen and pale.     Right Sinus: No maxillary sinus tenderness or frontal sinus tenderness.     Left Sinus: No maxillary sinus tenderness or frontal sinus tenderness.     Mouth/Throat:     Lips: Pink.     Mouth: Mucous membranes are moist. Mucous membranes are not pale and not dry.     Pharynx: Uvula midline.  Eyes:     General: Lids are normal. No allergic shiner.       Right eye: No discharge.        Left eye: No discharge.     Conjunctiva/sclera: Conjunctivae normal.     Right eye: Right conjunctiva is not injected. No chemosis.    Left eye: Left conjunctiva is not injected. No chemosis.    Pupils: Pupils are equal, round, and reactive to light.  Cardiovascular:     Rate and Rhythm: Normal rate and regular rhythm.     Heart sounds: Normal heart  sounds.  Pulmonary:     Effort: Pulmonary effort is normal. No tachypnea, accessory muscle usage or respiratory distress.     Breath sounds: Normal breath sounds. No wheezing, rhonchi or rales.     Comments: Moving air well in all lung fields.  Chest:     Chest wall: No tenderness.  Lymphadenopathy:     Cervical: No cervical adenopathy.  Skin:    Coloration: Skin is not pale.     Findings: No abrasion, erythema, petechiae or rash. Rash is not papular, urticarial or vesicular.  Neurological:     Mental Status: Ryan Hanson is alert.  Psychiatric:        Behavior: Behavior is cooperative.      Diagnostic studies:    Spirometry: results normal (FEV1: 1.93/74%, FVC: 2.54/72%, FEV1/FVC: 76%).    Spirometry consistent with normal pattern.   Allergy Studies: none       Francis Dowse  Dellis Anes, MD  [facility]

## 2023-06-15 ENCOUNTER — Other Ambulatory Visit: Payer: Self-pay | Admitting: Internal Medicine

## 2023-06-15 ENCOUNTER — Encounter: Payer: Self-pay | Admitting: Internal Medicine

## 2023-06-15 ENCOUNTER — Ambulatory Visit (INDEPENDENT_AMBULATORY_CARE_PROVIDER_SITE_OTHER): Payer: No Typology Code available for payment source | Admitting: Internal Medicine

## 2023-06-15 VITALS — BP 126/65 | HR 75 | Ht 68.0 in | Wt 180.2 lb

## 2023-06-15 DIAGNOSIS — I10 Essential (primary) hypertension: Secondary | ICD-10-CM

## 2023-06-15 DIAGNOSIS — I350 Nonrheumatic aortic (valve) stenosis: Secondary | ICD-10-CM | POA: Diagnosis not present

## 2023-06-15 DIAGNOSIS — Z0001 Encounter for general adult medical examination with abnormal findings: Secondary | ICD-10-CM

## 2023-06-15 DIAGNOSIS — N1831 Chronic kidney disease, stage 3a: Secondary | ICD-10-CM

## 2023-06-15 DIAGNOSIS — J4550 Severe persistent asthma, uncomplicated: Secondary | ICD-10-CM | POA: Diagnosis not present

## 2023-06-15 DIAGNOSIS — Z7984 Long term (current) use of oral hypoglycemic drugs: Secondary | ICD-10-CM

## 2023-06-15 DIAGNOSIS — E785 Hyperlipidemia, unspecified: Secondary | ICD-10-CM

## 2023-06-15 NOTE — Assessment & Plan Note (Signed)
 BP Readings from Last 1 Encounters:  06/15/23 126/65   Well-controlled with diet modification Counseled for compliance with the medications Advised DASH diet and moderate exercise/walking as tolerated

## 2023-06-15 NOTE — Assessment & Plan Note (Addendum)
 Has systolic murmur, although Echo previously showed only trace AS - [facility] Followed by Cardiology Asymptomatic currently

## 2023-06-15 NOTE — Progress Notes (Signed)
 Established Patient Office Visit  Subjective:  Patient ID: Ryan Hanson, male    DOB: 1935/05/17  Age: 88 y.o. MRN: [id number]  CC:  Chief Complaint  Patient presents with   Annual Exam    Cpe / 6 month f/u    HPI Ryan Hanson is a 88 y.o. male with past medical history of asthma and HLD who presents for f/u annual physical.  HTN and AS: BP is well-controlled. Patient denies headache, dizziness, chest pain, dyspnea or palpitations. He is followed by [facility] for AS. He is euvolemic  currently.  CKD:  He recently had blood tests in 12/24, which showed GFR ~54, usually stays 55-60.  He currently denies any dysuria, hematuria or urinary hesitance or resistance. He is on Jardiance currently.  Chronic low back pain: He reports intermittent low back pain, which is dull, nonradiating and worse with bending.  He had an episode of low back pain recently, which lasted for 2 weeks.  Denied any recent fall or injury.  Denies any numbness or tingling of the LE.  Past Medical History:  Diagnosis Date   Allergy    Angio-edema    Asbestos exposure    Cataract    Chronic cough    Dyspnea    chronic   Exposure to other animate mechanical forces, initial encounter    Heart murmur    History of kidney stones    Hyperlipidemia    Hyperlipidemia, unspecified    Localized swelling, mass and lump, right lower limb    Moderate persistent asthma without complication 03/23/2019   OSA (obstructive sleep apnea) 03/10/2019   Recurrent upper respiratory infection (URI)    Swelling of throat 02/24/2019   Unspecified chronic bronchitis (HCC)     Past Surgical History:  Procedure Laterality Date   CATARACT EXTRACTION W/PHACO Right 03/19/2015   Procedure: CATARACT EXTRACTION PHACO AND INTRAOCULAR LENS PLACEMENT; CDE:  9.70;  Surgeon: Susa Simmonds, MD;  Location: [facility];  Service: Ophthalmology;  Laterality: Right;   CATARACT EXTRACTION W/PHACO Left 03/16/2017   Procedure: CATARACT  EXTRACTION PHACO AND INTRAOCULAR LENS PLACEMENT (IOC);  Surgeon: Gemma Payor, MD;  Location: [facility];  Service: Ophthalmology;  Laterality: Left;  CDE: 26.41   EYE SURGERY      Family History  Problem Relation Age of Onset   Cancer Mother    COPD Father    Allergic rhinitis Neg Hx    Angioedema Neg Hx    Asthma Neg Hx    Atopy Neg Hx    Eczema Neg Hx    Immunodeficiency Neg Hx    Urticaria Neg Hx     Social History   Socioeconomic History   Marital status: Widowed    Spouse name: Not on file   Number of children: 1   Years of education: Not on file   Highest education level: 12th grade  Occupational History    Comment: Retired, Nature conservation officer   Tobacco Use   Smoking status: Former    Current packs/day: 0.00    Average packs/day: 2.0 packs/day for 30.0 years (60.0 ttl pk-yrs)    Types: Cigarettes    Start date: 03/12/1948    Quit date: 03/12/1978    Years since quitting: 45.2   Smokeless tobacco: Never  Vaping Use   Vaping status: Never Used  Substance and Sexual Activity   Alcohol use: No   Drug use: No   Sexual activity: Not Currently    Birth control/protection: None  Other Topics Concern   Not on file  Social History Narrative   Lives alone    Dog: Celenia- yorkie       Son lives close       Enjoys: helping people with things, gym work       Diet: eats all food groups    Caffeine: decaf    Water: 6-8 cups a day       Wears seat belt   Does not use phone while driving    Smoke Psychologist, prison and probation services in home, safe area    Social Drivers of Health   Financial Resource Strain: Low Risk  (06/12/2022)   Overall Financial Resource Strain (CARDIA)    Difficulty of Paying Living Expenses: Not hard at all  Food Insecurity: No Food Insecurity (06/12/2022)   Hunger Vital Sign    Worried About Running Out of Food in the Last Year: Never true    Ran Out of Food in the Last Year: Never true  Transportation Needs: No Transportation Needs  (06/12/2022)   PRAPARE - Administrator, Civil Service (Medical): No    Lack of Transportation (Non-Medical): No  Physical Activity: Sufficiently Active (06/12/2022)   Exercise Vital Sign    Days of Exercise per Week: 5 days    Minutes of Exercise per Session: 120 min  Stress: No Stress Concern Present (06/12/2022)   [employer] of Occupational Health - Occupational Stress Questionnaire    Feeling of Stress : Only a little  Social Connections: Unknown (06/12/2022)   Social Connection and Isolation Panel [NHANES]    Frequency of Communication with Friends and Family: Once a week    Frequency of Social Gatherings with Friends and Family: More than three times a week    Attends Religious Services: Patient declined    Database administrator or Organizations: No    Attends Banker Meetings: Not on file    Marital Status: Widowed  Intimate Partner Violence: Not At Risk (09/18/2021)   Humiliation, Afraid, Rape, and Kick questionnaire    Fear of Current or Ex-Partner: No    Emotionally Abused: No    Physically Abused: No    Sexually Abused: No    Outpatient Medications Prior to Visit  Medication Sig Dispense Refill   acetaminophen (TYLENOL) 500 MG tablet Take 1,000 mg by mouth daily.     albuterol (VENTOLIN HFA) 108 (90 Base) MCG/ACT inhaler Inhale 2 puffs into the lungs every 6 (six) hours as needed for wheezing or shortness of breath. 18 g 2   atorvastatin (LIPITOR) 10 MG tablet TAKE 1 TABLET(10 MG) BY MOUTH DAILY 90 tablet 3   benralizumab (FASENRA) 30 MG/ML prefilled syringe Inject 1 mL (30 mg total) into the skin every 8 (eight) weeks. 1 mL 6   cetirizine (ZYRTEC ALLERGY) 10 MG tablet Take 1 tablet (10 mg total) by mouth daily. 30 tablet 11   empagliflozin (JARDIANCE) 25 MG TABS tablet Take 12.5 mg by mouth daily.     fluticasone (FLONASE) 50 MCG/ACT nasal spray Place 1 spray into both nostrils daily. 16 g 11   cetirizine (ZYRTEC) 10 MG tablet Take 10 mg by  mouth daily.     Facility-Administered Medications Prior to Visit  Medication Dose Route Frequency Provider Last Rate Last Admin   Benralizumab SOSY 30 mg  30 mg Subcutaneous Q8 Thomes Dinning, MD   30 mg at 05/08/23 1057    Allergies  Allergen Reactions   Ceftin [Cefuroxime Axetil]     ROS Review of Systems  Constitutional:  Negative for chills and fever.  HENT:  Positive for hearing loss. Negative for congestion and sore throat.   Eyes:  Negative for pain and discharge.  Respiratory:  Negative for cough and shortness of breath.   Cardiovascular:  Negative for chest pain and palpitations.  Gastrointestinal:  Negative for diarrhea, nausea and vomiting.  Endocrine: Negative for polydipsia and polyuria.  Genitourinary:  Negative for dysuria and hematuria.  Musculoskeletal:  Positive for back pain. Negative for neck pain and neck stiffness.  Skin:  Negative for rash.  Neurological:  Negative for dizziness, weakness, numbness and headaches.  Psychiatric/Behavioral:  Negative for agitation and behavioral problems.       Objective:    Physical Exam Vitals reviewed.  Constitutional:      General: He is not in acute distress.    Appearance: He is not diaphoretic.  HENT:     Head: Normocephalic and atraumatic.     Nose: Nose normal.     Mouth/Throat:     Mouth: Mucous membranes are moist.  Eyes:     General: No scleral icterus.    Extraocular Movements: Extraocular movements intact.  Cardiovascular:     Rate and Rhythm: Normal rate and regular rhythm.     Pulses: Normal pulses.     Heart sounds: Murmur (Over right and left sternal border) heard.  Pulmonary:     Breath sounds: Normal breath sounds. No wheezing or rales.  Abdominal:     Palpations: Abdomen is soft.     Tenderness: There is no abdominal tenderness.  Musculoskeletal:     Cervical back: Neck supple. No tenderness.     Right lower leg: No edema.     Left lower leg: No edema.  Skin:    General:  Skin is warm.     Findings: No rash.  Neurological:     General: No focal deficit present.     Mental Status: He is alert and oriented to person, place, and time.     Sensory: No sensory deficit.     Motor: No weakness.  Psychiatric:        Mood and Affect: Mood normal.        Behavior: Behavior normal.     BP 126/65   Pulse 75   Ht 5\' 8"  (1.727 m)   Wt 180 lb 3.2 oz (81.7 kg)   SpO2 94%   BMI 27.40 kg/m  Wt Readings from Last 3 Encounters:  06/15/23 180 lb 3.2 oz (81.7 kg)  05/08/23 180 lb (81.6 kg)  12/15/22 177 lb 9.6 oz (80.6 kg)    Lab Results  Component Value Date   TSH 1.99 11/10/2018   Lab Results  Component Value Date   WBC 7.1 04/05/2021   HGB 14.9 04/05/2021   HCT 43.5 04/05/2021   MCV 90 04/05/2021   PLT 234 04/05/2021   Lab Results  Component Value Date   NA 141 04/14/2022   K 5.0 04/14/2022   CO2 23 04/14/2022   GLUCOSE 84 04/14/2022   BUN 14 04/14/2022   CREATININE 1.3 02/18/2023   BILITOT 0.5 04/14/2022   ALKPHOS 121 04/14/2022   AST 20 04/14/2022   ALT 16 04/14/2022   PROT 6.7 04/14/2022   ALBUMIN 4.6 04/14/2022   CALCIUM 9.3 04/14/2022   ANIONGAP 8 03/13/2015   EGFR 59 02/18/2023   Lab Results  Component Value Date  CHOL 157 04/14/2022   Lab Results  Component Value Date   HDL 63 04/14/2022   Lab Results  Component Value Date   LDLCALC 77 04/14/2022   Lab Results  Component Value Date   TRIG 89 04/14/2022   Lab Results  Component Value Date   CHOLHDL 2.5 04/14/2022   Lab Results  Component Value Date   HGBA1C 5.6 01/02/2023      Assessment & Plan:   Problem List Items Addressed This Visit       Cardiovascular and Mediastinum   Aortic stenosis   Has systolic murmur, although Echo previously showed only trace AS - had recent Echo at [facility] clinic Followed by Cardiology Asymptomatic currently      Essential hypertension   BP Readings from Last 1 Encounters:  06/15/23 126/65   Well-controlled with diet  modification Counseled for compliance with the medications Advised DASH diet and moderate exercise/walking as tolerated        Respiratory   Severe persistent asthma without complication   Followed by Allergy clinic Has Albuterol, needs to use PRN for dyspnea - rarely needs it On Fasenra        Genitourinary   Stage 3a chronic kidney disease (HCC)   Last BMP reviewed, at [place], GFR: 54 GFR ranges around 55 On Jardiance Maintain adequate hydration Avoid nephrotoxic agents        Other   Encounter for general adult medical examination with abnormal findings - Primary   Physical exam as documented. Fasting blood tests at [facility] clinic.       Prefers to get blood tests at [facility] clinic.   No orders of the defined types were placed in this encounter.   Follow-up: Return in about 6 months (around 12/15/2023) for HTN and CKD.    Meldon Sport, MD

## 2023-06-15 NOTE — Patient Instructions (Addendum)
 Schedule your Medicare Annual Wellness Visit at checkout.  Please continue to take medications as prescribed.  Please continue to follow low carb diet and perform moderate exercise/walking as tolerated.

## 2023-06-15 NOTE — Assessment & Plan Note (Signed)
 Physical exam as documented. Fasting blood tests at [facility] clinic.

## 2023-06-15 NOTE — Assessment & Plan Note (Signed)
Followed by Allergy clinic Has Albuterol, needs to use PRN for dyspnea - rarely needs it On Fasenra

## 2023-06-15 NOTE — Assessment & Plan Note (Addendum)
 Last BMP reviewed, at [place], GFR: 54 GFR ranges around 55 On Jardiance Maintain adequate hydration Avoid nephrotoxic agents

## 2023-06-22 ENCOUNTER — Other Ambulatory Visit: Payer: Self-pay | Admitting: *Deleted

## 2023-06-22 MED ORDER — FASENRA 30 MG/ML ~~LOC~~ SOSY: 30.0000 mg | PREFILLED_SYRINGE | SUBCUTANEOUS | 6 refills | Status: AC

## 2023-07-02 ENCOUNTER — Other Ambulatory Visit: Payer: Self-pay | Admitting: Internal Medicine

## 2023-07-02 DIAGNOSIS — E785 Hyperlipidemia, unspecified: Secondary | ICD-10-CM

## 2023-07-06 ENCOUNTER — Ambulatory Visit

## 2023-07-06 ENCOUNTER — Telehealth: Payer: Self-pay | Admitting: *Deleted

## 2023-07-06 DIAGNOSIS — J4550 Severe persistent asthma, uncomplicated: Secondary | ICD-10-CM | POA: Diagnosis not present

## 2023-07-06 NOTE — Telephone Encounter (Signed)
 Patient called back was advised to contact me regarding bill but not seeing where he needed to reach out to me

## 2023-07-06 NOTE — Telephone Encounter (Signed)
 Patient called l/m for me to call him. I returned call to him and l/m to contact me

## 2023-08-31 ENCOUNTER — Ambulatory Visit (INDEPENDENT_AMBULATORY_CARE_PROVIDER_SITE_OTHER)

## 2023-08-31 DIAGNOSIS — J4550 Severe persistent asthma, uncomplicated: Secondary | ICD-10-CM

## 2023-09-30 ENCOUNTER — Telehealth: Payer: Self-pay

## 2023-09-30 NOTE — Transitions of Care (Post Inpatient/ED Visit) (Signed)
 09/30/2023  Name: Ryan Hanson MRN: [id number] DOB: 1935/06/16  Today's TOC FU Call Status: Today's TOC FU Call Status:: Successful TOC FU Call Completed TOC FU Call Complete Date: 09/30/23 Patient's Name and Date of Birth confirmed.  Transition Care Management Follow-up Telephone Call Date of Discharge: 09/29/23 Discharge Facility: Other ([facility]) Name of Other ([facility]) Discharge Facility: [facility] Type of Discharge: Inpatient Admission Primary Inpatient Discharge Diagnosis:: TAVR How have you been since you were released from the hospital?: Better ( I am doing great) Any questions or concerns?: No  Items Reviewed: Did you receive and understand the discharge instructions provided?: Yes Medications obtained,verified, and reconciled?: Yes (Medications Reviewed) Any new allergies since your discharge?: No Dietary orders reviewed?: No Do you have support at home?: Yes People in Home [RPT]: other relative(s) Name of Support/Comfort Primary Source: daughter in law  Medications Reviewed Today: Medications Reviewed Today     Reviewed by Rumalda Alan PENNER, RN (Registered Nurse) on 09/30/23 at 1208  Med List Status: <None>   Medication Order Taking? Sig Documenting Provider Last Dose Status Informant  acetaminophen (TYLENOL) 500 MG tablet [id number] Yes Take 1,000 mg by mouth daily. [provider]  Active Self  albuterol  (VENTOLIN  HFA) 108 (90 Base) MCG/ACT inhaler [id number]  Inhale 2 puffs into the lungs every 6 (six) hours as needed for wheezing or shortness of breath. Iva Marty Saltness, MD  Active   amoxicillin (AMOXIL) 500 MG tablet [id number]  Take 2,000 mg by mouth See admin instructions.  Patient not taking: Reported on 09/30/2023   [provider]  Active   aspirin EC 81 MG tablet [id number] Yes Take 81 mg by mouth daily. Swallow whole. [provider]  Active   atorvastatin  (LIPITOR) 10 MG tablet [id number] Yes TAKE 1 TABLET(10 MG) BY  MOUTH DAILY Tobie Suzzane POUR, MD  Active   benralizumab  (FASENRA ) 30 MG/ML prefilled syringe [id number] Yes Inject 1 mL (30 mg total) into the skin every 8 (eight) weeks. Iva Marty Saltness, MD  Active   Benralizumab  SOSY 30 mg [id number]   Iva Marty Saltness, MD  Active   cetirizine  (ZYRTEC  ALLERGY) 10 MG tablet [id number] Yes Take 1 tablet (10 mg total) by mouth daily. Iva Marty Saltness, MD  Active   clopidogrel (PLAVIX) 75 MG tablet [id number] Yes Take 75 mg by mouth daily. [provider]  Active   empagliflozin (JARDIANCE) 25 MG TABS tablet [id number] Yes Take 12.5 mg by mouth daily. [provider]  Active   fluticasone  (FLONASE ) 50 MCG/ACT nasal spray [id number] Yes Place 1 spray into both nostrils daily. Iva Marty Saltness, MD  Active   losartan (COZAAR) 50 MG tablet [id number] Yes Take 25 mg by mouth daily. [provider]  Active   Vitamin D , Ergocalciferol , (DRISDOL) 1.25 MG (50000 UNIT) CAPS capsule [id number] Yes Take 50,000 Units by mouth every 7 (seven) days. [provider]  Active             Home Care and Equipment/Supplies: Were Home Health Services Ordered?: No Any new equipment or medical supplies ordered?: No  Functional Questionnaire: Do you need assistance with bathing/showering or dressing?: No Do you need assistance with meal preparation?: No Do you need assistance with eating?: No Do you have difficulty maintaining continence: No Do you need assistance with getting out of bed/getting out of a chair/moving?: No Do you have difficulty managing or taking your medications?: No  Follow up appointments reviewed: PCP Follow-up appointment confirmed?: Yes Date of PCP  follow-up appointment?: 10/07/23 Follow-up Provider: [facility] Follow-up appointment confirmed?: Yes Date of Specialist follow-up appointment?: 10/29/23 Follow-Up Specialty Provider:: [facility] Do you need transportation to your follow-up  appointment?: No Do you understand care options if your condition(s) worsen?: Yes-patient verbalized understanding  SDOH Interventions Today    Flowsheet Row Most Recent Value  SDOH Interventions   Food Insecurity Interventions Intervention Not Indicated  Housing Interventions Intervention Not Indicated  Transportation Interventions Intervention Not Indicated  Utilities Interventions Intervention Not Indicated   Patient reports that he is feeling great.  Reports no pain today. States incision site is without infection. Reports that he was able to shower today without any difficulty. Reports that he is following his walking schedule per discharge instructions and states that he has already walked 3 times today.  Reports that he will pick up his medication (plavix) today.   Patient verbalizes understanding on instructions, activity limits.   Has follow up planned with PCP office next week.  Reviewed and offered 30 day TOC program and patient has consented.Chart sent to MD.   Goals Addressed             This Visit's Progress    [facility] Transitions of Care (TOC) Care Plan       Problems:  Recent Hospitalization for treatment of S/P TAVR.  09/30/2023  Reports doing well. No chest pain or shortness of breath Medication access barrier Does not have Plavix yet.  09/30/2023  Patient reports that he will pick up from pharmacy today when pharmacy gets medication in stock.  Goal:  Over the next 30 days, the patient will not experience hospital readmission  Interventions:  Transitions of Care: Doctor Visits  - discussed the importance of doctor visits Post discharge activity limitations prescribed by provider reviewed Post-op wound/incision care reviewed with patient/caregiver Reviewed Signs and symptoms of infection  Patient Self Care Activities:  Attend all scheduled provider appointments Attend church or other social activities Call pharmacy for medication refills 3-7 days in advance of  running out of medications Call provider office for new concerns or questions  Notify RN Care Manager of TOC call rescheduling needs Participate in Transition of Care Program/Attend TOC scheduled calls Perform all self care activities independently  Perform IADL's (shopping, preparing meals, housekeeping, managing finances) independently Take medications as prescribed    Plan:  Telephone follow up appointment with care management team member scheduled for:  10/08/2023 The patient has been provided with contact information for the care management team and has been advised to call with any health related questions or concerns.          Alan Ee, RN, BSN, CEN [employer]- Transition of Care Team.  [facility] [phone]

## 2023-10-07 ENCOUNTER — Inpatient Hospital Stay: Payer: Self-pay | Admitting: Nurse Practitioner

## 2023-10-08 ENCOUNTER — Other Ambulatory Visit (HOSPITAL_COMMUNITY): Payer: Self-pay

## 2023-10-08 ENCOUNTER — Other Ambulatory Visit: Payer: Self-pay

## 2023-10-08 DIAGNOSIS — Z952 Presence of prosthetic heart valve: Secondary | ICD-10-CM

## 2023-10-08 NOTE — Patient Instructions (Signed)
 Visit Information  Thank you for taking time to visit with me today. Please don't hesitate to contact me if I can be of assistance to you before our next scheduled telephone appointment.  Our next appointment is by telephone on 10/15/2023 at 2pm  Following is a copy of your care plan:   Goals Addressed             This Visit's Progress    [facility] Transitions of Care (TOC) Care Plan       Problems:  Recent Hospitalization for treatment of S/P TAVR.  09/30/2023  Reports doing well. No chest pain or shortness of breath 10/08/2023  Patient reports he is doing well. Denies any chest pain.  States that  I feel different but can not explain this.  Reports eating well, drinking well, sleeping well and states that he has increased his activity as directed.  Medication access barrier Does not have Plavix yet.  09/30/2023  Patient reports that he will pick up from pharmacy today when pharmacy gets medication in stock.  10/08/2023  Reports that he has all his medications including his Plavix  Goal:  Over the next 30 days, the patient will not experience hospital readmission  Interventions:  Transitions of Care: Doctor Visits  - discussed the importance of doctor visits- PCP follow up changed by MD office.  Post discharge activity limitations prescribed by provider reviewed Post-op wound/incision care reviewed with patient/caregiver- denies any concerns with procedure site. Encouraged patient to continue to self monitoring BP and Pulse and call MD for any concerns Reassessed pain- 0  Patient Self Care Activities:  Attend all scheduled provider appointments Attend church or other social activities Call pharmacy for medication refills 3-7 days in advance of running out of medications Call provider office for new concerns or questions  Notify RN Care Manager of TOC call rescheduling needs Participate in Transition of Care Program/Attend TOC scheduled calls Perform all self care activities  independently  Perform IADL's (shopping, preparing meals, housekeeping, managing finances) independently Take medications as prescribed   Call MD for any changes in condition.   Plan:  Telephone follow up appointment with care management team member scheduled for:  10/15/2023 The patient has been provided with contact information for the care management team and has been advised to call with any health related questions or concerns.         Patient verbalizes understanding of instructions and care plan provided today and agrees to view in MyChart. Active MyChart status and patient understanding of how to access instructions and care plan via MyChart confirmed with patient.       Please call the care guide team at [phone] if you need to cancel or reschedule your appointment.   Please call the Suicide and Crisis Lifeline: 988 call the [place]  National Suicide Prevention Lifeline: 770-635-6739 or TTY: 531-206-7587 TTY (971) 860-1403) to talk to a trained counselor call 1-800-273-TALK (toll free, 24 hour hotline) call 911 if you are experiencing a Mental Health or Behavioral Health Crisis or need someone to talk to.  Alan Ee, RN, BSN, CEN [employer]- Transition of Care Team.  [facility] [phone]

## 2023-10-08 NOTE — Transitions of Care (Post Inpatient/ED Visit) (Signed)
 Transition of Care week 2  Visit Note  10/08/2023  Name: Ryan Hanson MRN: [id number]          DOB: Apr 21, 1935  Situation: Patient enrolled in [facility] 30-day program. Visit completed with patient by telephone.   Background:   Initial Transition Care Management Follow-up Telephone Call    Past Medical History:  Diagnosis Date   Allergy    Angio-edema    Asbestos exposure    Cataract    Chronic cough    Dyspnea    chronic   Exposure to other animate mechanical forces, initial encounter    Heart murmur    History of kidney stones    Hyperlipidemia    Hyperlipidemia, unspecified    Localized swelling, mass and lump, right lower limb    Moderate persistent asthma without complication 03/23/2019   OSA (obstructive sleep apnea) 03/10/2019   Recurrent upper respiratory infection (URI)    Swelling of throat 02/24/2019   Unspecified chronic bronchitis (HCC)     Assessment: Patient reports that he is doing very well. Reports that he is walking and going to the gym. Denies chest pain or shortness of breath, Reports taking all medications as prescribed.  Patient Reported Symptoms: Cognitive Cognitive Status: Able to follow simple commands, Alert and oriented to person, place, and time, Normal speech and language skills      Neurological Neurological Review of Symptoms: No symptoms reported    HEENT HEENT Symptoms Reported: No symptoms reported      Cardiovascular Cardiovascular Symptoms Reported: Other: Other Cardiovascular Symptoms: denies any chest pain-  states he feels different but can not describe this feeling. Reports BP range of 133-138/71-78. Pulse 72-95 with home monitoring Does patient have uncontrolled Hypertension?: No Cardiovascular Management Strategies: Medication therapy, Medical device Cardiovascular Comment: denies any signs of infection. reports just bruising in his groin.  Respiratory Respiratory Symptoms Reported: No symptoms reported    Endocrine Endocrine  Symptoms Reported: No symptoms reported Is patient diabetic?: No    Gastrointestinal Gastrointestinal Symptoms Reported: No symptoms reported      Genitourinary Genitourinary Symptoms Reported: No symptoms reported    Integumentary Additional Integumentary Details: brusing to the right groin. Patient denies any signs of infection. Skin Self-Management Outcome: 3 (uncertain)  Musculoskeletal Musculoskelatal Symptoms Reviewed: No symptoms reported        Psychosocial Psychosocial Symptoms Reported: No symptoms reported         Vitals:   Today's Vitals   10/08/23 1349  PainSc: 0-No pain     Medications Reviewed Today     Reviewed by Rumalda Alan PENNER, RN (Registered Nurse) on 10/08/23 at 1337  Med List Status: <None>   Medication Order Taking? Sig Documenting Provider Last Dose Status Informant  acetaminophen (TYLENOL) 500 MG tablet [id number] Yes Take 1,000 mg by mouth daily. [provider]  Active Self  albuterol  (VENTOLIN  HFA) 108 (90 Base) MCG/ACT inhaler [id number]  Inhale 2 puffs into the lungs every 6 (six) hours as needed for wheezing or shortness of breath. Iva Marty Saltness, MD  Active   amoxicillin (AMOXIL) 500 MG tablet [id number] Yes Take 2,000 mg by mouth See admin instructions. [provider]  Active   aspirin EC 81 MG tablet [id number] Yes Take 81 mg by mouth daily. Swallow whole. [provider]  Active   atorvastatin  (LIPITOR) 10 MG tablet [id number] Yes TAKE 1 TABLET(10 MG) BY MOUTH DAILY Tobie Suzzane POUR, MD  Active   benralizumab  (FASENRA ) 30 MG/ML prefilled syringe [id number] Yes  Inject 1 mL (30 mg total) into the skin every 8 (eight) weeks. Iva Marty Saltness, MD  Active   Benralizumab  SOSY 30 mg [id number]   Iva Marty Saltness, MD  Active   cetirizine  (ZYRTEC  ALLERGY) 10 MG tablet [id number] Yes Take 1 tablet (10 mg total) by mouth daily. Iva Marty Saltness, MD  Active   clopidogrel (PLAVIX) 75 MG tablet [id number] Yes Take 75  mg by mouth daily. [provider]  Active   empagliflozin (JARDIANCE) 25 MG TABS tablet [id number]  Take 12.5 mg by mouth daily. [provider]  Active   fluticasone  (FLONASE ) 50 MCG/ACT nasal spray [id number]  Place 1 spray into both nostrils daily. Iva Marty Saltness, MD  Active   losartan (COZAAR) 50 MG tablet [id number] Yes Take 25 mg by mouth daily. [provider]  Active   Vitamin D , Ergocalciferol , (DRISDOL) 1.25 MG (50000 UNIT) CAPS capsule [id number] Yes Take 50,000 Units by mouth every 7 (seven) days. [provider]  Active             Goals Addressed             This Visit's Progress    [facility] Transitions of Care (TOC) Care Plan       Problems:  Recent Hospitalization for treatment of S/P TAVR.  09/30/2023  Reports doing well. No chest pain or shortness of breath 10/08/2023  Patient reports he is doing well. Denies any chest pain.  States that  I feel different but can not explain this.  Reports eating well, drinking well, sleeping well and states that he has increased his activity as directed.  Medication access barrier Does not have Plavix yet.  09/30/2023  Patient reports that he will pick up from pharmacy today when pharmacy gets medication in stock.  10/08/2023  Reports that he has all his medications including his Plavix  Goal:  Over the next 30 days, the patient will not experience hospital readmission  Interventions:  Transitions of Care: Doctor Visits  - discussed the importance of doctor visits- PCP follow up changed by MD office.  Post discharge activity limitations prescribed by provider reviewed Post-op wound/incision care reviewed with patient/caregiver- denies any concerns with procedure site. Encouraged patient to continue to self monitoring BP and Pulse and call MD for any concerns Reassessed pain- 0  Patient Self Care Activities:  Attend all scheduled provider appointments Attend church or other social  activities Call pharmacy for medication refills 3-7 days in advance of running out of medications Call provider office for new concerns or questions  Notify RN Care Manager of TOC call rescheduling needs Participate in Transition of Care Program/Attend TOC scheduled calls Perform all self care activities independently  Perform IADL's (shopping, preparing meals, housekeeping, managing finances) independently Take medications as prescribed   Call MD for any changes in condition.   Plan:  Telephone follow up appointment with care management team member scheduled for:  10/15/2023 The patient has been provided with contact information for the care management team and has been advised to call with any health related questions or concerns.          Recommendation:   Continue Current Plan of Care  Follow Up Plan:   Telephone follow up appointment date/time:  10/15/2023  at 2pm  Alan Ee, RN, BSN, CEN [facility]- Transition of Care Team.  [facility] [phone]

## 2023-10-15 ENCOUNTER — Other Ambulatory Visit: Payer: Self-pay

## 2023-10-15 ENCOUNTER — Ambulatory Visit (INDEPENDENT_AMBULATORY_CARE_PROVIDER_SITE_OTHER): Payer: Self-pay | Admitting: Internal Medicine

## 2023-10-15 ENCOUNTER — Encounter: Payer: Self-pay | Admitting: Internal Medicine

## 2023-10-15 VITALS — BP 138/72 | HR 71 | Ht 68.0 in | Wt 175.4 lb

## 2023-10-15 DIAGNOSIS — N1831 Chronic kidney disease, stage 3a: Secondary | ICD-10-CM | POA: Diagnosis not present

## 2023-10-15 DIAGNOSIS — Z952 Presence of prosthetic heart valve: Secondary | ICD-10-CM | POA: Insufficient documentation

## 2023-10-15 DIAGNOSIS — I350 Nonrheumatic aortic (valve) stenosis: Secondary | ICD-10-CM | POA: Diagnosis not present

## 2023-10-15 NOTE — Assessment & Plan Note (Addendum)
 Last BMP reviewed, usually GFR ranges 50-55 GFR ranges around 55 On losartan and Jardiance Maintain adequate hydration Avoid nephrotoxic agents Recheck BMP

## 2023-10-15 NOTE — Patient Instructions (Signed)
 Visit Information  Thank you for taking time to visit with me today. Please don't hesitate to contact me if I can be of assistance to you before our next scheduled telephone appointment.  Our next appointment is by telephone on 10/22/2023 at 2pm  Following is a copy of your care plan:   Goals Addressed             This Visit's Progress    [facility] Transitions of Care (TOC) Care Plan       Problems:  Recent Hospitalization for treatment of S/P TAVR.  09/30/2023  Reports doing well. No chest pain or shortness of breath 10/08/2023  Patient reports he is doing well. Denies any chest pain.  States that  I feel different but can not explain this.  Reports eating well, drinking well, sleeping well and states that he has increased his activity as directed. 10/15/2023  Patient reports that he is doing excellent. Reports that he has his follow up with PCP  today. Reports no chest pain, no swelling. Reports eating, drinking and sleeping well. Reports weight and BP stable. Denies any new concerns today.  Medication access barrier Does not have Plavix yet.  09/30/2023  Patient reports that he will pick up from pharmacy today when pharmacy gets medication in stock.  10/08/2023  Reports that he has all his medications including his Plavix.  10/15/2023 taking all medications as prescribed.   Goal:  Over the next 30 days, the patient will not experience hospital readmission  Interventions:  Transitions of Care: Doctor Visits  - discussed the importance of doctor visits- completed Post discharge activity limitations prescribed by provider reviewed Assess procedure site and patient reports sites are all healed. Denies any bruising. Reviewed with patient to continue to self monitoring BP and Pulse and call MD for any concerns Reassessed pain- 0 Reviewed current activity levels- walking 17 minutes 3 times per day. Working out at [employer] for upper body and following weight restrictions.   Patient Self Care  Activities:  Attend all scheduled provider appointments Attend church or other social activities Call pharmacy for medication refills 3-7 days in advance of running out of medications Call provider office for new concerns or questions  Notify RN Care Manager of TOC call rescheduling needs Participate in Transition of Care Program/Attend TOC scheduled calls Perform all self care activities independently  Perform IADL's (shopping, preparing meals, housekeeping, managing finances) independently Take medications as prescribed   Call MD for any changes in condition.  Continue to weigh daily and call MD for weight gain Continue to follow your exercise plan.  Plan:  Telephone follow up appointment with care management team member scheduled for:  10/22/2023 The patient has been provided with contact information for the care management team and has been advised to call with any health related questions or concerns.         Patient verbalizes understanding of instructions and care plan provided today and agrees to view in MyChart. Active MyChart status and patient understanding of how to access instructions and care plan via MyChart confirmed with patient.     Telephone follow up appointment with care management team member scheduled for:  10/22/2023 2pm  Please call the care guide team at [phone] if you need to cancel or reschedule your appointment.   Please call the Suicide and Crisis Lifeline: 988 call the [place]  National Suicide Prevention Lifeline: 403 526 7573 or TTY: 320-834-6985 TTY 628-198-9099) to talk to a trained counselor call 1-800-273-TALK (toll free, 24  hour hotline) call 911 if you are experiencing a Mental Health or Behavioral Health Crisis or need someone to talk to.  Alan Ee, RN, BSN, CEN [employer]- Transition of Care Team.  [facility] [phone]

## 2023-10-15 NOTE — Transitions of Care (Post Inpatient/ED Visit) (Signed)
 Transition of Care week 3  Visit Note  10/15/2023  Name: Ryan Hanson MRN: [id number]          DOB: 02/16/1936  Situation: Patient enrolled in [facility] 30-day program. Visit completed with patient by telephone.   Background:   Initial Transition Care Management Follow-up Telephone Call    Past Medical History:  Diagnosis Date   Allergy    Angio-edema    Asbestos exposure    Cataract    Chronic cough    Dyspnea    chronic   Exposure to other animate mechanical forces, initial encounter    Heart murmur    History of kidney stones    Hyperlipidemia    Hyperlipidemia, unspecified    Localized swelling, mass and lump, right lower limb    Moderate persistent asthma without complication 03/23/2019   OSA (obstructive sleep apnea) 03/10/2019   Recurrent upper respiratory infection (URI)    Swelling of throat 02/24/2019   Unspecified chronic bronchitis (HCC)     Assessment: no new problems or concerns today Patient Reported Symptoms: Cognitive Cognitive Status: Able to follow simple commands, Alert and oriented to person, place, and time, Normal speech and language skills      Neurological Neurological Review of Symptoms: No symptoms reported    HEENT HEENT Symptoms Reported: No symptoms reported      Cardiovascular Cardiovascular Symptoms Reported: Other: Other Cardiovascular Symptoms: denies chest pain. reports that he is active. Denies any concerns with heart at this time. Does patient have uncontrolled Hypertension?: No Weight: 178 lb (80.7 kg) (home monitoring) Cardiovascular Self-Management Outcome: 5 (very good)  Respiratory Respiratory Symptoms Reported: No symptoms reported    Endocrine Endocrine Symptoms Reported: No symptoms reported Is patient diabetic?: No    Gastrointestinal Gastrointestinal Symptoms Reported: No symptoms reported      Genitourinary Genitourinary Symptoms Reported: No symptoms reported    Integumentary Integumentary Symptoms Reported: No  symptoms reported, Other Additional Integumentary Details: reports well healed    Musculoskeletal Musculoskelatal Symptoms Reviewed: No symptoms reported        Psychosocial Psychosocial Symptoms Reported: No symptoms reported         Vitals:   10/15/23 1422  BP: 138/72  Pulse: 71   Today's Vitals   10/15/23 1422  BP: 138/72  Pulse: 71  Weight: 178 lb (80.7 kg)  PainSc: 0-No pain    Medications Reviewed Today     Reviewed by Rumalda Alan PENNER, RN (Registered Nurse) on 10/15/23 at 1412  Med List Status: <None>   Medication Order Taking? Sig Documenting Provider Last Dose Status Informant  acetaminophen (TYLENOL) 500 MG tablet [id number] Yes Take 1,000 mg by mouth daily. [provider]  Active Self  albuterol  (VENTOLIN  HFA) 108 (90 Base) MCG/ACT inhaler [id number] Yes Inhale 2 puffs into the lungs every 6 (six) hours as needed for wheezing or shortness of breath. Iva Marty Saltness, MD  Active   amoxicillin (AMOXIL) 500 MG tablet [id number] Yes Take 2,000 mg by mouth See admin instructions. [provider]  Active   aspirin EC 81 MG tablet [id number] Yes Take 81 mg by mouth daily. Swallow whole. [provider]  Active   atorvastatin  (LIPITOR) 10 MG tablet [id number] Yes TAKE 1 TABLET(10 MG) BY MOUTH DAILY Tobie Suzzane POUR, MD  Active   benralizumab  (FASENRA ) 30 MG/ML prefilled syringe [id number] Yes Inject 1 mL (30 mg total) into the skin every 8 (eight) weeks. Iva Marty Saltness, MD  Active   Benralizumab  SOSY 30 mg  [id number]   Iva Marty Saltness, MD  Active   cetirizine  (ZYRTEC  ALLERGY) 10 MG tablet [id number] Yes Take 1 tablet (10 mg total) by mouth daily. Iva Marty Saltness, MD  Active   clopidogrel (PLAVIX) 75 MG tablet [id number] Yes Take 75 mg by mouth daily. [provider]  Active   empagliflozin (JARDIANCE) 25 MG TABS tablet [id number] Yes Take 12.5 mg by mouth daily. [provider]  Active   fluticasone  (FLONASE ) 50  MCG/ACT nasal spray [id number] Yes Place 1 spray into both nostrils daily. Iva Marty Saltness, MD  Active   losartan (COZAAR) 50 MG tablet [id number] Yes Take 25 mg by mouth daily. [provider]  Active   Vitamin D , Ergocalciferol , (DRISDOL) 1.25 MG (50000 UNIT) CAPS capsule [id number] Yes Take 50,000 Units by mouth every 7 (seven) days. [provider]  Active             Goals      Prevent falls     [facility] Transitions of Care (TOC) Care Plan     Problems:  Recent Hospitalization for treatment of S/P TAVR.  09/30/2023  Reports doing well. No chest pain or shortness of breath 10/08/2023  Patient reports he is doing well. Denies any chest pain.  States that  I feel different but can not explain this.  Reports eating well, drinking well, sleeping well and states that he has increased his activity as directed. 10/15/2023  Patient reports that he is doing excellent. Reports that he has his follow up with PCP  today. Reports no chest pain, no swelling. Reports eating, drinking and sleeping well. Reports weight and BP stable. Denies any new concerns today.  Medication access barrier Does not have Plavix yet.  09/30/2023  Patient reports that he will pick up from pharmacy today when pharmacy gets medication in stock.  10/08/2023  Reports that he has all his medications including his Plavix.  10/15/2023 taking all medications as prescribed.   Goal:  Over the next 30 days, the patient will not experience hospital readmission  Interventions:  Transitions of Care: Doctor Visits  - discussed the importance of doctor visits- completed Post discharge activity limitations prescribed by provider reviewed Assess procedure site and patient reports sites are all healed. Denies any bruising. Reviewed with patient to continue to self monitoring BP and Pulse and call MD for any concerns Reassessed pain- 0 Reviewed current activity levels- walking 17 minutes 3 times per day. Working out at [employer] for upper body and following weight restrictions.   Patient Self Care Activities:  Attend all scheduled provider appointments Attend church or other social activities Call pharmacy for medication refills 3-7 days in advance of running out of medications Call provider office for new concerns or questions  Notify RN Care Manager of TOC call rescheduling needs Participate in Transition of Care Program/Attend TOC scheduled calls Perform all self care activities independently  Perform IADL's (shopping, preparing meals, housekeeping, managing finances) independently Take medications as prescribed   Call MD for any changes in condition.  Continue to weigh daily and call MD for weight gain Continue to follow your exercise plan.  Plan:  Telephone follow up appointment with care management team member scheduled for:  10/22/2023 The patient has been provided with contact information for the care management team and has been advised to call with any health related questions or concerns.         Recommendation:   Continue Current Plan of Care  Follow Up  Plan:   Telephone follow up appointment date/time:  10/22/2023 2pm  Alan Ee, RN, BSN, CEN Population Health- Transition of Care Team.  [facility] [phone]

## 2023-10-15 NOTE — Progress Notes (Signed)
 Established Patient Office Visit  Subjective:  Patient ID: Ryan Hanson, male    DOB: Jul 04, 1935  Age: 88 y.o. MRN: [id number]  CC:  Chief Complaint  Patient presents with   Follow-up    F/u     HPI Ryan Hanson is a 88 y.o. male with past medical history of asthma, HTN, AS, CKD and HLD who presents for follow-up after recent hospitalization from 09/28/23-09/29/23.  He presented to [facility] for planned TAVR with Dr Zhao. Pt received a 26mm Sapien S3 UR, there was 0% gradient and no paravalvular regurgitation. He was admitted to cardiology service for monitoring overnight. TTE obtained and revealed normal LVEF, s/p TAVR with no paravalvular regurg, pericardial effusion. Pt was monitored on telemetry overnight and did not have any conduction changes.  He was discharged with DAPT with aspirin and Plavix.  Denies any signs of active bleeding or easy bruising.  He is planned to follow up with Dr. Zhao in the outpatient setting.  Past Medical History:  Diagnosis Date   Allergy    Angio-edema    Asbestos exposure    Cataract    Chronic cough    Dyspnea    chronic   Exposure to other animate mechanical forces, initial encounter    Heart murmur    History of kidney stones    Hyperlipidemia    Hyperlipidemia, unspecified    Localized swelling, mass and lump, right lower limb    Moderate persistent asthma without complication 03/23/2019   OSA (obstructive sleep apnea) 03/10/2019   Recurrent upper respiratory infection (URI)    Swelling of throat 02/24/2019   Unspecified chronic bronchitis (HCC)     Past Surgical History:  Procedure Laterality Date   CATARACT EXTRACTION W/PHACO Right 03/19/2015   Procedure: CATARACT EXTRACTION PHACO AND INTRAOCULAR LENS PLACEMENT; CDE:  9.70;  Surgeon: Dow JULIANNA Burke, MD;  Location: [facility];  Service: Ophthalmology;  Laterality: Right;   CATARACT EXTRACTION W/PHACO Left 03/16/2017   Procedure: CATARACT EXTRACTION PHACO  AND INTRAOCULAR LENS PLACEMENT (IOC);  Surgeon: Perley Hamilton, MD;  Location: [facility];  Service: Ophthalmology;  Laterality: Left;  CDE: 26.41   EYE SURGERY      Family History  Problem Relation Age of Onset   Cancer Mother    COPD Father    Allergic rhinitis Neg Hx    Angioedema Neg Hx    Asthma Neg Hx    Atopy Neg Hx    Eczema Neg Hx    Immunodeficiency Neg Hx    Urticaria Neg Hx     Social History   Socioeconomic History   Marital status: Widowed    Spouse name: Not on file   Number of children: 1   Years of education: Not on file   Highest education level: 12th grade  Occupational History    Comment: Retired, [employer]   Tobacco Use   Smoking status: Former    Current packs/day: 0.00    Average packs/day: 2.0 packs/day for 30.0 years (60.0 ttl pk-yrs)    Types: Cigarettes    Start date: 03/12/1948    Quit date: 03/12/1978    Years since quitting: 45.6   Smokeless tobacco: Never  Vaping Use   Vaping status: Never Used  Substance and Sexual Activity   Alcohol use: No   Drug use: No   Sexual activity: Not Currently    Birth control/protection: None  Other Topics Concern   Not on file  Social History  Narrative   Lives alone    Dog: Celenia- [place]ie       Son lives close       Enjoys: helping people with things, gym work       Diet: eats all food groups    Caffeine: decaf    Water: 6-8 cups a day       Wears seat belt   Does not use phone while driving    Smoke Psychologist, prison and probation services in home, safe area    Social Drivers of Health   Financial Resource Strain: Low Risk  (06/12/2022)   Overall Financial Resource Strain (CARDIA)    Difficulty of Paying Living Expenses: Not hard at all  Food Insecurity: No Food Insecurity (09/30/2023)   Hunger Vital Sign    Worried About Running Out of Food in the Last Year: Never true    Ran Out of Food in the Last Year: Never true  Transportation Needs: No Transportation Needs (09/30/2023)   PRAPARE -  Administrator, Civil Service (Medical): No    Lack of Transportation (Non-Medical): No  Physical Activity: Sufficiently Active (06/12/2022)   Exercise Vital Sign    Days of Exercise per Week: 5 days    Minutes of Exercise per Session: 120 min  Stress: No Stress Concern Present (06/12/2022)   [employer] of Occupational Health - Occupational Stress Questionnaire    Feeling of Stress : Only a little  Social Connections: Unknown (06/12/2022)   Social Connection and Isolation Panel    Frequency of Communication with Friends and Family: Once a week    Frequency of Social Gatherings with Friends and Family: More than three times a week    Attends Religious Services: Patient declined    Database administrator or Organizations: No    Attends Banker Meetings: Not on file    Marital Status: Widowed  Intimate Partner Violence: Not At Risk (09/30/2023)   Humiliation, Afraid, Rape, and Kick questionnaire    Fear of Current or Ex-Partner: No    Emotionally Abused: No    Physically Abused: No    Sexually Abused: No    Outpatient Medications Prior to Visit  Medication Sig Dispense Refill   acetaminophen (TYLENOL) 500 MG tablet Take 1,000 mg by mouth daily.     amoxicillin (AMOXIL) 500 MG tablet Take 2,000 mg by mouth See admin instructions.     aspirin EC 81 MG tablet Take 81 mg by mouth daily. Swallow whole.     atorvastatin  (LIPITOR) 10 MG tablet TAKE 1 TABLET(10 MG) BY MOUTH DAILY 90 tablet 3   benralizumab  (FASENRA ) 30 MG/ML prefilled syringe Inject 1 mL (30 mg total) into the skin every 8 (eight) weeks. 1 mL 6   cetirizine  (ZYRTEC  ALLERGY) 10 MG tablet Take 1 tablet (10 mg total) by mouth daily. 30 tablet 11   clopidogrel (PLAVIX) 75 MG tablet Take 75 mg by mouth daily.     empagliflozin (JARDIANCE) 25 MG TABS tablet Take 12.5 mg by mouth daily.     fluticasone  (FLONASE ) 50 MCG/ACT nasal spray Place 1 spray into both nostrils daily. 16 g 11   losartan (COZAAR) 50  MG tablet Take 25 mg by mouth daily.     Vitamin D , Ergocalciferol , (DRISDOL) 1.25 MG (50000 UNIT) CAPS capsule Take 50,000 Units by mouth every 7 (seven) days.     albuterol  (VENTOLIN  HFA) 108 (90 Base) MCG/ACT inhaler Inhale 2 puffs into the  lungs every 6 (six) hours as needed for wheezing or shortness of breath. 18 g 2   Facility-Administered Medications Prior to Visit  Medication Dose Route Frequency Provider Last Rate Last Admin   Benralizumab  SOSY 30 mg  30 mg Subcutaneous Q8 Weeks Gallagher, Joel Louis, MD   30 mg at 08/31/23 0900    Allergies  Allergen Reactions   Ceftin [Cefuroxime Axetil]     ROS Review of Systems  Constitutional:  Negative for chills and fever.  HENT:  Positive for hearing loss. Negative for congestion and sore throat.   Eyes:  Negative for pain and discharge.  Respiratory:  Negative for cough and shortness of breath.   Cardiovascular:  Negative for chest pain and palpitations.  Gastrointestinal:  Negative for diarrhea, nausea and vomiting.  Endocrine: Negative for polydipsia and polyuria.  Genitourinary:  Negative for dysuria and hematuria.  Musculoskeletal:  Positive for back pain. Negative for neck pain and neck stiffness.  Skin:  Negative for rash.  Neurological:  Negative for dizziness, weakness, numbness and headaches.  Psychiatric/Behavioral:  Negative for agitation and behavioral problems.       Objective:    Physical Exam Vitals reviewed.  Constitutional:      General: He is not in acute distress.    Appearance: He is not diaphoretic.  HENT:     Head: Normocephalic and atraumatic.     Nose: Nose normal.     Mouth/Throat:     Mouth: Mucous membranes are moist.  Eyes:     General: No scleral icterus.    Extraocular Movements: Extraocular movements intact.  Cardiovascular:     Rate and Rhythm: Normal rate and regular rhythm.     Pulses: Normal pulses.     Heart sounds: Murmur (Over right and left sternal border) heard.  Pulmonary:      Breath sounds: Normal breath sounds. No wheezing or rales.  Musculoskeletal:     Cervical back: Neck supple. No tenderness.     Right lower leg: No edema.     Left lower leg: No edema.  Skin:    General: Skin is warm.     Findings: No rash.  Neurological:     General: No focal deficit present.     Mental Status: He is alert and oriented to person, place, and time.     Sensory: No sensory deficit.     Motor: No weakness.  Psychiatric:        Mood and Affect: Mood normal.        Behavior: Behavior normal.     BP 138/72   Pulse 71   Ht 5' 8 (1.727 m)   Wt 175 lb 6.4 oz (79.6 kg)   SpO2 98%   BMI 26.67 kg/m  Wt Readings from Last 3 Encounters:  10/15/23 178 lb (80.7 kg)  10/15/23 175 lb 6.4 oz (79.6 kg)  06/15/23 180 lb 3.2 oz (81.7 kg)    Lab Results  Component Value Date   TSH 1.99 11/10/2018   Lab Results  Component Value Date   WBC 7.2 10/15/2023   HGB 14.6 10/15/2023   HCT 44.5 10/15/2023   MCV 95 10/15/2023   PLT 255 10/15/2023   Lab Results  Component Value Date   NA 137 10/15/2023   K 4.7 10/15/2023   CO2 22 10/15/2023   GLUCOSE 86 10/15/2023   BUN 19 10/15/2023   CREATININE 1.21 10/15/2023   BILITOT 0.5 04/14/2022   ALKPHOS 121 04/14/2022   AST 20  04/14/2022   ALT 16 04/14/2022   PROT 6.7 04/14/2022   ALBUMIN 4.6 04/14/2022   CALCIUM  9.4 10/15/2023   ANIONGAP 8 03/13/2015   EGFR 59 (L) 10/15/2023   Lab Results  Component Value Date   CHOL 157 04/14/2022   Lab Results  Component Value Date   HDL 63 04/14/2022   Lab Results  Component Value Date   LDLCALC 77 04/14/2022   Lab Results  Component Value Date   TRIG 89 04/14/2022   Lab Results  Component Value Date   CHOLHDL 2.5 04/14/2022   Lab Results  Component Value Date   HGBA1C 5.6 01/02/2023      Assessment & Plan:   Problem List Items Addressed This Visit       Cardiovascular and Mediastinum   Aortic stenosis   S/p TAVR (07/25) Has systolic murmur Followed by  Cardiology Asymptomatic currently        Genitourinary   Stage 3a chronic kidney disease (HCC)   Last BMP reviewed, usually GFR ranges 50-55 GFR ranges around 55 On losartan and Jardiance Maintain adequate hydration Avoid nephrotoxic agents Recheck BMP      Relevant Orders   CBC with Differential/Platelet (Completed)   Basic Metabolic Panel (BMET) (Completed)     Other   S/P TAVR (transcatheter aortic valve replacement) - Primary   Recent hospitalization for TAVR He tolerated procedure well Currently asymptomatic Currently on DAPT with aspirin and Plavix - check CBC       No orders of the defined types were placed in this encounter.   Follow-up: Return if symptoms worsen or fail to improve.    Suzzane MARLA Blanch, MD

## 2023-10-15 NOTE — Patient Instructions (Signed)
 Please continue to take medications as prescribed.  Please continue to follow low salt diet and ambulate as tolerated.  Please discuss with Cardiologist about duration of Plavix and aspirin.

## 2023-10-16 ENCOUNTER — Ambulatory Visit: Payer: Self-pay | Admitting: Internal Medicine

## 2023-10-16 LAB — CBC WITH DIFFERENTIAL/PLATELET
Basophils Absolute: 0 x10E3/uL (ref 0.0–0.2)
Basos: 0 %
EOS (ABSOLUTE): 0 x10E3/uL (ref 0.0–0.4)
Eos: 0 %
Hematocrit: 44.5 % (ref 37.5–51.0)
Hemoglobin: 14.6 g/dL (ref 13.0–17.7)
Immature Grans (Abs): 0 x10E3/uL (ref 0.0–0.1)
Immature Granulocytes: 0 %
Lymphocytes Absolute: 2.3 x10E3/uL (ref 0.7–3.1)
Lymphs: 32 %
MCH: 31.2 pg (ref 26.6–33.0)
MCHC: 32.8 g/dL (ref 31.5–35.7)
MCV: 95 fL (ref 79–97)
Monocytes Absolute: 0.7 x10E3/uL (ref 0.1–0.9)
Monocytes: 10 %
Neutrophils Absolute: 4.1 x10E3/uL (ref 1.4–7.0)
Neutrophils: 58 %
Platelets: 255 x10E3/uL (ref 150–450)
RBC: 4.68 x10E6/uL (ref 4.14–5.80)
RDW: 12.4 % (ref 11.6–15.4)
WBC: 7.2 x10E3/uL (ref 3.4–10.8)

## 2023-10-16 LAB — BASIC METABOLIC PANEL WITH GFR
BUN/Creatinine Ratio: 16 (ref 10–24)
BUN: 19 mg/dL (ref 8–27)
CO2: 22 mmol/L (ref 20–29)
Calcium: 9.4 mg/dL (ref 8.6–10.2)
Chloride: 101 mmol/L (ref 96–106)
Creatinine, Ser: 1.21 mg/dL (ref 0.76–1.27)
Glucose: 86 mg/dL (ref 70–99)
Potassium: 4.7 mmol/L (ref 3.5–5.2)
Sodium: 137 mmol/L (ref 134–144)
eGFR: 59 mL/min/1.73 — ABNORMAL LOW (ref >59)

## 2023-10-16 NOTE — Assessment & Plan Note (Signed)
 S/p TAVR (07/25) Has systolic murmur Followed by Cardiology Asymptomatic currently

## 2023-10-16 NOTE — Assessment & Plan Note (Signed)
 Recent hospitalization for TAVR He tolerated procedure well Currently asymptomatic Currently on DAPT with aspirin and Plavix - check CBC

## 2023-10-22 ENCOUNTER — Other Ambulatory Visit: Payer: Self-pay

## 2023-10-22 NOTE — Transitions of Care (Post Inpatient/ED Visit) (Signed)
 Transition of Care week 4  Visit Note  10/22/2023  Name: Ryan Hanson MRN: [id number]          DOB: 05/02/1935  Situation: Patient enrolled in [facility] 30-day program. Visit completed with patient by telephone.   Background:   Initial Transition Care Management Follow-up Telephone Call    Past Medical History:  Diagnosis Date   Allergy    Angio-edema    Asbestos exposure    Cataract    Chronic cough    Dyspnea    chronic   Exposure to other animate mechanical forces, initial encounter    Heart murmur    History of kidney stones    Hyperlipidemia    Hyperlipidemia, unspecified    Localized swelling, mass and lump, right lower limb    Moderate persistent asthma without complication 03/23/2019   OSA (obstructive sleep apnea) 03/10/2019   Recurrent upper respiratory infection (URI)    Swelling of throat 02/24/2019   Unspecified chronic bronchitis (HCC)     Assessment: Reports doing well. No complaints today Patient Reported Symptoms: Cognitive Cognitive Status: Able to follow simple commands, Alert and oriented to person, place, and time, Normal speech and language skills      Neurological Neurological Review of Symptoms: No symptoms reported    HEENT HEENT Symptoms Reported: No symptoms reported      Cardiovascular Cardiovascular Symptoms Reported: Other: Other Cardiovascular Symptoms: Denies chest pain and shortness of breath, Reports weight range of 178-180. no swelling. Does patient have uncontrolled Hypertension?: No Cardiovascular Management Strategies: Medication therapy Weight: 178 lb (80.7 kg) Cardiovascular Self-Management Outcome: 5 (very good)  Respiratory Respiratory Symptoms Reported: No symptoms reported Respiratory Self-Management Outcome: 5 (very good)  Endocrine Endocrine Symptoms Reported: No symptoms reported Is patient diabetic?: No    Gastrointestinal Gastrointestinal Symptoms Reported: No symptoms reported      Genitourinary Genitourinary  Symptoms Reported: No symptoms reported    Integumentary Integumentary Symptoms Reported: No symptoms reported    Musculoskeletal Musculoskelatal Symptoms Reviewed: No symptoms reported        Psychosocial Psychosocial Symptoms Reported: No symptoms reported         Vitals:   10/21/23 1356  BP: 135/70  Pulse: 70   Today's Vitals   10/21/23 1356 10/22/23 1354 10/22/23 1355  BP: 135/70    Pulse: 70    Weight: 178 lb (80.7 kg) 178 lb (80.7 kg)   PainSc:   0-No pain    Medications Reviewed Today     Reviewed by Rumalda Alan PENNER, RN (Registered Nurse) on 10/22/23 at 1346  Med List Status: <None>   Medication Order Taking? Sig Documenting Provider Last Dose Status Informant  acetaminophen (TYLENOL) 500 MG tablet [id number] Yes Take 1,000 mg by mouth daily. [provider]  Active Self  albuterol  (VENTOLIN  HFA) 108 (90 Base) MCG/ACT inhaler [id number] Yes Inhale 2 puffs into the lungs every 6 (six) hours as needed for wheezing or shortness of breath. Iva Marty Saltness, MD  Active   amoxicillin (AMOXIL) 500 MG tablet [id number] Yes Take 2,000 mg by mouth See admin instructions. [provider]  Active   aspirin EC 81 MG tablet [id number] Yes Take 81 mg by mouth daily. Swallow whole. [provider]  Active   atorvastatin  (LIPITOR) 10 MG tablet [id number] Yes TAKE 1 TABLET(10 MG) BY MOUTH DAILY Tobie Suzzane POUR, MD  Active   benralizumab  (FASENRA ) 30 MG/ML prefilled syringe [id number] Yes Inject 1 mL (30 mg total) into the skin every 8 (eight)  weeks. Iva Marty Saltness, MD  Active   Benralizumab  SOSY 30 mg [id number]   Iva Marty Saltness, MD  Active   cetirizine  (ZYRTEC  ALLERGY) 10 MG tablet [id number] Yes Take 1 tablet (10 mg total) by mouth daily. Iva Marty Saltness, MD  Active   clopidogrel (PLAVIX) 75 MG tablet [id number] Yes Take 75 mg by mouth daily. [provider]  Active   empagliflozin (JARDIANCE) 25 MG TABS tablet [id number] Yes Take 12.5  mg by mouth daily. [provider]  Active   fluticasone  (FLONASE ) 50 MCG/ACT nasal spray [id number] Yes Place 1 spray into both nostrils daily. Iva Marty Saltness, MD  Active   losartan (COZAAR) 50 MG tablet [id number] Yes Take 25 mg by mouth daily. [provider]  Active   Vitamin D , Ergocalciferol , (DRISDOL) 1.25 MG (50000 UNIT) CAPS capsule [id number] Yes Take 50,000 Units by mouth every 7 (seven) days. [provider]  Active             Goals Addressed             This Visit's Progress    [facility] Transitions of Care (TOC) Care Plan       Problems:  Recent Hospitalization for treatment of S/P TAVR.  09/30/2023  Reports doing well. No chest pain or shortness of breath 10/08/2023  Patient reports he is doing well. Denies any chest pain.  States that  I feel different but can not explain this.  Reports eating well, drinking well, sleeping well and states that he has increased his activity as directed. 10/15/2023  Patient reports that he is doing excellent. Reports that he has his follow up with PCP  today. Reports no chest pain, no swelling. Reports eating, drinking and sleeping well. Reports weight and BP stable. Denies any new concerns today. 10/22/2023  Patient reports no pain no shortness of breath. Remain active with exercise. No new problems or concerns today.  Medication access barrier Does not have Plavix yet.  09/30/2023  Patient reports that he will pick up from pharmacy today when pharmacy gets medication in stock.  10/08/2023  Reports that he has all his medications including his Plavix.  10/15/2023 taking all medications as prescribed. 10/22/2023  Denies any concerns about his medications. Taking all medications as prescribed.   Goal:  Over the next 30 days, the patient will not experience hospital readmission  Interventions:  Transitions of Care: Doctor Visits  - discussed the importance of doctor visits- Post discharge activity limitations  prescribed by provider reviewed Reviewed recent BP and weight. Encouraged patient to continue to self monitor.   Reassessed pain- 0 Reviewed current activity levels-  Patient Self Care Activities:  Attend all scheduled provider appointments Attend church or other social activities Call pharmacy for medication refills 3-7 days in advance of running out of medications Call provider office for new concerns or questions  Notify RN Care Manager of TOC call rescheduling needs Participate in Transition of Care Program/Attend TOC scheduled calls Perform all self care activities independently  Perform IADL's (shopping, preparing meals, housekeeping, managing finances) independently Take medications as prescribed   Call MD for any changes in condition.  Continue to weigh daily and call MD for weight gain Continue to follow your exercise plan.  Plan:  Telephone follow up appointment with care management team member scheduled for:  10/28/2023 The patient has been provided with contact information for the care management team and has been advised to call with any health related questions or concerns.  Recommendation:   Continue Current Plan of Care  Follow Up Plan:   Telephone follow up appointment date/time:  10/28/2023   Alan Ee, RN, BSN, CEN [facility]- Transition of Care Team.  [facility] [phone]

## 2023-10-22 NOTE — Patient Instructions (Signed)
 Visit Information  Thank you for taking time to visit with me today. Please don't hesitate to contact me if I can be of assistance to you before our next scheduled telephone appointment.  Our next appointment is by telephone on 10/28/2023 at 1 pm  Following is a copy of your care plan:   Goals Addressed             This Visit's Progress    [facility] Transitions of Care (TOC) Care Plan       Problems:  Recent Hospitalization for treatment of S/P TAVR.  09/30/2023  Reports doing well. No chest pain or shortness of breath 10/08/2023  Patient reports he is doing well. Denies any chest pain.  States that  I feel different but can not explain this.  Reports eating well, drinking well, sleeping well and states that he has increased his activity as directed. 10/15/2023  Patient reports that he is doing excellent. Reports that he has his follow up with PCP  today. Reports no chest pain, no swelling. Reports eating, drinking and sleeping well. Reports weight and BP stable. Denies any new concerns today. 10/22/2023  Patient reports no pain no shortness of breath. Remain active with exercise. No new problems or concerns today.  Medication access barrier Does not have Plavix yet.  09/30/2023  Patient reports that he will pick up from pharmacy today when pharmacy gets medication in stock.  10/08/2023  Reports that he has all his medications including his Plavix.  10/15/2023 taking all medications as prescribed. 10/22/2023  Denies any concerns about his medications. Taking all medications as prescribed.   Goal:  Over the next 30 days, the patient will not experience hospital readmission  Interventions:  Transitions of Care: Doctor Visits  - discussed the importance of doctor visits- Post discharge activity limitations prescribed by provider reviewed Reviewed recent BP and weight. Encouraged patient to continue to self monitor.   Reassessed pain- 0 Reviewed current activity levels-  Patient Self Care  Activities:  Attend all scheduled provider appointments Attend church or other social activities Call pharmacy for medication refills 3-7 days in advance of running out of medications Call provider office for new concerns or questions  Notify RN Care Manager of TOC call rescheduling needs Participate in Transition of Care Program/Attend TOC scheduled calls Perform all self care activities independently  Perform IADL's (shopping, preparing meals, housekeeping, managing finances) independently Take medications as prescribed   Call MD for any changes in condition.  Continue to weigh daily and call MD for weight gain Continue to follow your exercise plan.  Plan:  Telephone follow up appointment with care management team member scheduled for:  10/28/2023 The patient has been provided with contact information for the care management team and has been advised to call with any health related questions or concerns.         Patient verbalizes understanding of instructions and care plan provided today and agrees to view in MyChart. Active MyChart status and patient understanding of how to access instructions and care plan via MyChart confirmed with patient.     Telephone follow up appointment with care management team member scheduled for: 10/28/2023 at 1pm  Please call the care guide team at [phone] if you need to cancel or reschedule your appointment.   Please call the Suicide and Crisis Lifeline: 988 call the [place]  National Suicide Prevention Lifeline: 704-405-6614 or TTY: (443)758-3881 TTY 810-012-3853) to talk to a trained counselor call 1-800-273-TALK (toll free, 24 hour hotline) call  911 if you are experiencing a Mental Health or Behavioral Health Crisis or need someone to talk to.  Alan Ee, RN, BSN, CEN [employer]- Transition of Care Team.  [facility] [phone]

## 2023-10-26 ENCOUNTER — Ambulatory Visit (INDEPENDENT_AMBULATORY_CARE_PROVIDER_SITE_OTHER)

## 2023-10-26 DIAGNOSIS — J4550 Severe persistent asthma, uncomplicated: Secondary | ICD-10-CM | POA: Diagnosis not present

## 2023-10-28 ENCOUNTER — Other Ambulatory Visit: Payer: Self-pay

## 2023-10-28 NOTE — Patient Instructions (Signed)
 Visit Information  Thank you for taking time to visit with me today.  Following is a copy of your care plan:   Goals Addressed             This Visit's Progress    COMPLETED: [facility] Transitions of Care (TOC) Care Plan       Problems:  Recent Hospitalization for treatment of S/P TAVR.  09/30/2023  Reports doing well. No chest pain or shortness of breath 10/08/2023  Patient reports he is doing well. Denies any chest pain.  States that  I feel different but can not explain this.  Reports eating well, drinking well, sleeping well and states that he has increased his activity as directed. 10/15/2023  Patient reports that he is doing excellent. Reports that he has his follow up with PCP  today. Reports no chest pain, no swelling. Reports eating, drinking and sleeping well. Reports weight and BP stable. Denies any new concerns today. 10/22/2023  Patient reports no pain no shortness of breath. Remain active with exercise. No new problems or concerns today.  Medication access barrier Does not have Plavix yet.  09/30/2023  Patient reports that he will pick up from pharmacy today when pharmacy gets medication in stock.  10/08/2023  Reports that he has all his medications including his Plavix.  10/15/2023 taking all medications as prescribed. 10/22/2023  Denies any concerns about his medications. Taking all medications as prescribed.   Goal:  Over the next 30 days, the patient will not experience hospital readmission  Interventions:  Transitions of Care: Doctor Visits  - discussed the importance of doctor visits- Post discharge activity limitations prescribed by provider reviewed Reviewed recent BP and weight. Encouraged patient to continue to self monitor.   Reassessed pain- 0 Reviewed current activity levels- Reviewed and offered CCM services and patient declined.   Patient Self Care Activities:  Attend all scheduled provider appointments Attend church or other social activities Call pharmacy for  medication refills 3-7 days in advance of running out of medications Call provider office for new concerns or questions  Perform all self care activities independently  Perform IADL's (shopping, preparing meals, housekeeping, managing finances) independently Take medications as prescribed   Call MD for any changes in condition.  Continue to weigh daily and call MD for weight gain Continue to follow your exercise plan.  Plan:  Completed TOC program successfully.  Declined CCM services.        Patient verbalizes understanding of instructions and care plan provided today and agrees to view in MyChart. Active MyChart status and patient understanding of how to access instructions and care plan via MyChart confirmed with patient.     Call MD for any changes in condition.   Please call the Suicide and Crisis Lifeline: 988 call the [place]  National Suicide Prevention Lifeline: (551)423-8506 or TTY: 218 690 9455 TTY 628-087-7408) to talk to a trained counselor call 1-800-273-TALK (toll free, 24 hour hotline) call 911 if you are experiencing a Mental Health or Behavioral Health Crisis or need someone to talk to.  Alan Ee, RN, BSN, CEN [employer]- Transition of Care Team.  [facility] [phone]

## 2023-10-28 NOTE — Transitions of Care (Post Inpatient/ED Visit) (Signed)
 Transition of Care week 5  Visit Note  10/28/2023  Name: Ryan Hanson MRN: [id number]          DOB: [birth year]  Situation: Patient enrolled in [facility] 30-day program. Visit completed with patient by telephone.   Background:   Initial Transition Care Management Follow-up Telephone Call    Past Medical History:  Diagnosis Date   Allergy    Angio-edema    Asbestos exposure    Cataract    Chronic cough    Dyspnea    chronic   Exposure to other animate mechanical forces, initial encounter    Heart murmur    History of kidney stones    Hyperlipidemia    Hyperlipidemia, unspecified    Localized swelling, mass and lump, right lower limb    Moderate persistent asthma without complication 03/23/2019   OSA (obstructive sleep apnea) 03/10/2019   Recurrent upper respiratory infection (URI)    Swelling of throat 02/24/2019   Unspecified chronic bronchitis (HCC)     Assessment: Doing well. No new concerns today. Patient Reported Symptoms: Cognitive Cognitive Status: Able to follow simple commands, Alert and oriented to person, place, and time, Normal speech and language skills      Neurological Neurological Review of Symptoms: No symptoms reported    HEENT HEENT Symptoms Reported: No symptoms reported      Cardiovascular Cardiovascular Symptoms Reported: Other: Other Cardiovascular Symptoms: Denies any chest pain or shortness of breath. Does patient have uncontrolled Hypertension?: No Weight: 178 lb (80.7 kg) Cardiovascular Self-Management Outcome: 4 (good)  Respiratory Respiratory Symptoms Reported: No symptoms reported    Endocrine Endocrine Symptoms Reported: No symptoms reported Is patient diabetic?: No    Gastrointestinal Gastrointestinal Symptoms Reported: No symptoms reported      Genitourinary Genitourinary Symptoms Reported: No symptoms reported    Integumentary Integumentary Symptoms Reported: No symptoms reported    Musculoskeletal Musculoskelatal Symptoms  Reviewed: No symptoms reported        Psychosocial Psychosocial Symptoms Reported: No symptoms reported         Today's Vitals   10/28/23 1221 10/28/23 1222  Weight: 178 lb (80.7 kg)   PainSc:  0-No pain     Medications Reviewed Today     Reviewed by Rumalda Alan PENNER, RN (Registered Nurse) on 10/28/23 at 1215  Med List Status: <None>   Medication Order Taking? Sig Documenting Provider Last Dose Status Informant  acetaminophen (TYLENOL) 500 MG tablet [id number] Yes Take 1,000 mg by mouth daily. [provider]  Active Self  albuterol  (VENTOLIN  HFA) 108 (90 Base) MCG/ACT inhaler [id number] Yes Inhale 2 puffs into the lungs every 6 (six) hours as needed for wheezing or shortness of breath. Iva Marty Saltness, MD  Active   amoxicillin (AMOXIL) 500 MG tablet [id number] Yes Take 2,000 mg by mouth See admin instructions. [provider]  Active   aspirin EC 81 MG tablet [id number] Yes Take 81 mg by mouth daily. Swallow whole. [provider]  Active   atorvastatin  (LIPITOR) 10 MG tablet [id number] Yes TAKE 1 TABLET(10 MG) BY MOUTH DAILY Tobie Suzzane POUR, MD  Active   benralizumab  (FASENRA ) 30 MG/ML prefilled syringe [id number] Yes Inject 1 mL (30 mg total) into the skin every 8 (eight) weeks. Iva Marty Saltness, MD  Active   Benralizumab  SOSY 30 mg [id number]   Iva Marty Saltness, MD  Active   cetirizine  (ZYRTEC  ALLERGY) 10 MG tablet [id number] Yes Take 1 tablet (10 mg total) by mouth daily. Iva Marty Saltness, MD  Active   clopidogrel (PLAVIX) 75 MG tablet [id number] Yes Take 75 mg by mouth daily. [provider]  Active   empagliflozin (JARDIANCE) 25 MG TABS tablet [id number] Yes Take 12.5 mg by mouth daily. [provider]  Active   fluticasone  (FLONASE ) 50 MCG/ACT nasal spray [id number] Yes Place 1 spray into both nostrils daily. Iva Marty Saltness, MD  Active   losartan (COZAAR) 50 MG tablet [id number] Yes Take 25 mg by mouth daily. [provider]  Active   Vitamin D , Ergocalciferol , (DRISDOL) 1.25 MG (50000 UNIT) CAPS capsule [id number] Yes Take 50,000 Units by mouth every 7 (seven) days. [provider]  Active             Goals      Prevent falls     [facility] Transitions of Care (TOC) Care Plan     Problems:  Recent Hospitalization for treatment of S/P TAVR.  09/30/2023  Reports doing well. No chest pain or shortness of breath 10/08/2023  Patient reports he is doing well. Denies any chest pain.  States that  I feel different but can not explain this.  Reports eating well, drinking well, sleeping well and states that he has increased his activity as directed. 10/15/2023  Patient reports that he is doing excellent. Reports that he has his follow up with PCP  today. Reports no chest pain, no swelling. Reports eating, drinking and sleeping well. Reports weight and BP stable. Denies any new concerns today. 10/22/2023  Patient reports no pain no shortness of breath. Remain active with exercise. No new problems or concerns today.  Medication access barrier Does not have Plavix yet.  09/30/2023  Patient reports that he will pick up from pharmacy today when pharmacy gets medication in stock.  10/08/2023  Reports that he has all his medications including his Plavix.  10/15/2023 taking all medications as prescribed. 10/22/2023  Denies any concerns about his medications. Taking all medications as prescribed.   Goal:  Over the next 30 days, the patient will not experience hospital readmission  Interventions:  Transitions of Care: Doctor Visits  - discussed the importance of doctor visits- Post discharge activity limitations prescribed by provider reviewed Reviewed recent BP and weight. Encouraged patient to continue to self monitor.   Reassessed pain- 0 Reviewed current activity levels- Reviewed and offered CCM services and patient declined.   Patient Self Care Activities:  Attend all scheduled provider  appointments Attend church or other social activities Call pharmacy for medication refills 3-7 days in advance of running out of medications Call provider office for new concerns or questions  Perform all self care activities independently  Perform IADL's (shopping, preparing meals, housekeeping, managing finances) independently Take medications as prescribed   Call MD for any changes in condition.  Continue to weigh daily and call MD for weight gain Continue to follow your exercise plan.  Plan:  Completed TOC program successfully.  Declined CCM services.        Recommendation:   Continue Current Plan of Care  Follow Up Plan:   Closing From:  Transitions of Care Program  Alan Ee, RN, BSN, [employer]- Transition of Care Team.  [facility] [phone]

## 2023-12-10 ENCOUNTER — Ambulatory Visit

## 2023-12-15 ENCOUNTER — Ambulatory Visit (INDEPENDENT_AMBULATORY_CARE_PROVIDER_SITE_OTHER): Admitting: Internal Medicine

## 2023-12-15 ENCOUNTER — Encounter: Payer: Self-pay | Admitting: Internal Medicine

## 2023-12-15 VITALS — BP 138/68 | HR 91 | Ht 68.0 in | Wt 179.0 lb

## 2023-12-15 DIAGNOSIS — J4550 Severe persistent asthma, uncomplicated: Secondary | ICD-10-CM

## 2023-12-15 DIAGNOSIS — Z23 Encounter for immunization: Secondary | ICD-10-CM | POA: Diagnosis not present

## 2023-12-15 DIAGNOSIS — I350 Nonrheumatic aortic (valve) stenosis: Secondary | ICD-10-CM

## 2023-12-15 DIAGNOSIS — N1831 Chronic kidney disease, stage 3a: Secondary | ICD-10-CM

## 2023-12-15 DIAGNOSIS — E782 Mixed hyperlipidemia: Secondary | ICD-10-CM | POA: Diagnosis not present

## 2023-12-15 DIAGNOSIS — I10 Essential (primary) hypertension: Secondary | ICD-10-CM | POA: Diagnosis not present

## 2023-12-15 DIAGNOSIS — R202 Paresthesia of skin: Secondary | ICD-10-CM | POA: Insufficient documentation

## 2023-12-15 NOTE — Assessment & Plan Note (Addendum)
 BP Readings from Last 1 Encounters:  12/15/23 138/68   Well-controlled with losartan 25 mg QD Counseled for compliance with the medications Advised DASH diet and moderate exercise/walking as tolerated

## 2023-12-15 NOTE — Assessment & Plan Note (Addendum)
 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

## 2023-12-15 NOTE — Progress Notes (Addendum)
 Established Patient Office Visit  Subjective:  Patient ID: Ryan Hanson, male    DOB: [birth year]  Age: 88 y.o. MRN: [id number]  CC:  Chief Complaint  Patient presents with   Hypertension    Six month follow up   Chronic Kidney Disease    Six month follow up    HPI Ryan Hanson is a 88 y.o. male with past medical history of asthma and HLD who presents for f/u of his chronic medical conditions.  HTN and AS: He had TAVR in 07/25.  He is on aspirin, Plavix and statin currently. BP is well-controlled. Patient denies headache, dizziness, chest pain, dyspnea or palpitations. He is followed by [facility] for AS. He is euvolemic  currently.  CKD:  He recently had blood tests in 08/25, which showed GFR 59, usually stays 55-60.  He currently denies any dysuria, hematuria or urinary hesitance or resistance. He is on Jardiance currently.  Chronic low back pain: He reports intermittent low back pain, which is dull, nonradiating and worse with bending.  He had an episode of low back pain recently, which lasted for 2 weeks.  Denied any recent fall or injury.  He has intermittent tingling of the bilateral forefoot area, but denies any burning pain.  Past Medical History:  Diagnosis Date   Allergy    Angio-edema    Asbestos exposure    Cataract    Chronic cough    Dyspnea    chronic   Exposure to other animate mechanical forces, initial encounter    Heart murmur    History of kidney stones    Hyperlipidemia    Hyperlipidemia, unspecified    Localized swelling, mass and lump, right lower limb    Moderate persistent asthma without complication 03/23/2019   OSA (obstructive sleep apnea) 03/10/2019   Recurrent upper respiratory infection (URI)    Swelling of throat 02/24/2019   Unspecified chronic bronchitis (HCC)     Past Surgical History:  Procedure Laterality Date   CATARACT EXTRACTION W/PHACO Right 03/19/2015   Procedure: CATARACT EXTRACTION PHACO AND INTRAOCULAR LENS PLACEMENT;  CDE:  9.70;  Surgeon: Dow JULIANNA Burke, MD;  Location: [facility];  Service: Ophthalmology;  Laterality: Right;   CATARACT EXTRACTION W/PHACO Left 03/16/2017   Procedure: CATARACT EXTRACTION PHACO AND INTRAOCULAR LENS PLACEMENT (IOC);  Surgeon: Perley Hamilton, MD;  Location: [facility];  Service: Ophthalmology;  Laterality: Left;  CDE: 26.41   EYE SURGERY      Family History  Problem Relation Age of Onset   Cancer Mother    COPD Father    Allergic rhinitis Neg Hx    Angioedema Neg Hx    Asthma Neg Hx    Atopy Neg Hx    Eczema Neg Hx    Immunodeficiency Neg Hx    Urticaria Neg Hx     Social History   Socioeconomic History   Marital status: Widowed    Spouse name: Not on file   Number of children: 1   Years of education: Not on file   Highest education level: 12th grade  Occupational History    Comment: Retired, [employer]   Tobacco Use   Smoking status: Former    Current packs/day: 0.00    Average packs/day: 2.0 packs/day for 30.0 years (60.0 ttl pk-yrs)    Types: Cigarettes    Start date: 03/12/1948    Quit date: 03/12/1978    Years since quitting: 45.7   Smokeless tobacco: Never  Vaping Use  Vaping status: Never Used  Substance and Sexual Activity   Alcohol use: No   Drug use: No   Sexual activity: Not Currently    Birth control/protection: None  Other Topics Concern   Not on file  Social History Narrative   Lives alone    Dog: Celenia- [place]ie       Son lives close       Enjoys: helping people with things, gym work       Diet: eats all food groups    Caffeine: decaf    Water: 6-8 cups a day       Wears seat belt   Does not use phone while driving    Smoke Psychologist, prison and probation services in home, safe area    Social Drivers of Health   Financial Resource Strain: Low Risk  (12/14/2023)   Overall Financial Resource Strain (CARDIA)    Difficulty of Paying Living Expenses: Not hard at all  Food Insecurity: No Food Insecurity (12/14/2023)   Hunger Vital  Sign    Worried About Running Out of Food in the Last Year: Never true    Ran Out of Food in the Last Year: Never true  Transportation Needs: Unknown (12/14/2023)   PRAPARE - Administrator, Civil Service (Medical): No    Lack of Transportation (Non-Medical): Not on file  Physical Activity: Sufficiently Active (12/14/2023)   Exercise Vital Sign    Days of Exercise per Week: 7 days    Minutes of Exercise per Session: 30 min  Stress: No Stress Concern Present (12/14/2023)   [employer] of Occupational Health - Occupational Stress Questionnaire    Feeling of Stress: Not at all  Social Connections: Unknown (12/14/2023)   Social Connection and Isolation Panel    Frequency of Communication with Friends and Family: Three times a week    Frequency of Social Gatherings with Friends and Family: More than three times a week    Attends Religious Services: More than 4 times per year    Active Member of [employer] or Organizations: Not on file    Attends Banker Meetings: Not on file    Marital Status: Widowed  Intimate Partner Violence: Not At Risk (09/30/2023)   Humiliation, Afraid, Rape, and Kick questionnaire    Fear of Current or Ex-Partner: No    Emotionally Abused: No    Physically Abused: No    Sexually Abused: No    Outpatient Medications Prior to Visit  Medication Sig Dispense Refill   acetaminophen (TYLENOL) 500 MG tablet Take 1,000 mg by mouth daily.     albuterol  (VENTOLIN  HFA) 108 (90 Base) MCG/ACT inhaler Inhale 2 puffs into the lungs every 6 (six) hours as needed for wheezing or shortness of breath. 18 g 2   amoxicillin (AMOXIL) 500 MG tablet Take 2,000 mg by mouth See admin instructions.     aspirin EC 81 MG tablet Take 81 mg by mouth daily. Swallow whole.     atorvastatin  (LIPITOR) 10 MG tablet TAKE 1 TABLET(10 MG) BY MOUTH DAILY 90 tablet 3   benralizumab  (FASENRA ) 30 MG/ML prefilled syringe Inject 1 mL (30 mg total) into the skin every 8 (eight)  weeks. 1 mL 6   cetirizine  (ZYRTEC  ALLERGY) 10 MG tablet Take 1 tablet (10 mg total) by mouth daily. 30 tablet 11   clopidogrel (PLAVIX) 75 MG tablet Take 75 mg by mouth daily.     empagliflozin (JARDIANCE) 25 MG TABS  tablet Take 12.5 mg by mouth daily.     fluticasone  (FLONASE ) 50 MCG/ACT nasal spray Place 1 spray into both nostrils daily. 16 g 11   losartan (COZAAR) 50 MG tablet Take 25 mg by mouth daily.     Vitamin D , Ergocalciferol , (DRISDOL) 1.25 MG (50000 UNIT) CAPS capsule Take 50,000 Units by mouth every 7 (seven) days.     Benralizumab  SOSY 30 mg      No facility-administered medications prior to visit.    Allergies  Allergen Reactions   Ceftin [Cefuroxime Axetil]     ROS Review of Systems  Constitutional:  Negative for chills and fever.  HENT:  Positive for hearing loss. Negative for congestion and sore throat.   Eyes:  Negative for pain and discharge.  Respiratory:  Negative for cough and shortness of breath.   Cardiovascular:  Negative for chest pain and palpitations.  Gastrointestinal:  Negative for diarrhea, nausea and vomiting.  Endocrine: Negative for polydipsia and polyuria.  Genitourinary:  Negative for dysuria and hematuria.  Musculoskeletal:  Positive for back pain. Negative for neck pain and neck stiffness.  Skin:  Negative for rash.  Neurological:  Negative for dizziness, weakness, numbness and headaches.  Psychiatric/Behavioral:  Negative for agitation and behavioral problems.       Objective:    Physical Exam Vitals reviewed.  Constitutional:      General: He is not in acute distress.    Appearance: He is not diaphoretic.  HENT:     Head: Normocephalic and atraumatic.     Nose: Nose normal.     Mouth/Throat:     Mouth: Mucous membranes are moist.  Eyes:     General: No scleral icterus.    Extraocular Movements: Extraocular movements intact.  Cardiovascular:     Rate and Rhythm: Normal rate and regular rhythm.     Pulses: Normal pulses.      Heart sounds: Murmur (Over right and left sternal border) heard.  Pulmonary:     Breath sounds: Normal breath sounds. No wheezing or rales.  Musculoskeletal:     Cervical back: Neck supple. No tenderness.     Right lower leg: No edema.     Left lower leg: No edema.  Skin:    General: Skin is warm.     Findings: No rash.  Neurological:     General: No focal deficit present.     Mental Status: He is alert and oriented to person, place, and time.     Sensory: No sensory deficit.     Motor: No weakness.  Psychiatric:        Mood and Affect: Mood normal.        Behavior: Behavior normal.     BP 138/68   Pulse 91   Ht 5' 8 (1.727 m)   Wt 179 lb (81.2 kg)   SpO2 96%   BMI 27.22 kg/m  Wt Readings from Last 3 Encounters:  12/15/23 179 lb (81.2 kg)  10/28/23 178 lb (80.7 kg)  10/22/23 178 lb (80.7 kg)    Lab Results  Component Value Date   TSH 1.99 11/10/2018   Lab Results  Component Value Date   WBC 7.2 10/15/2023   HGB 14.6 10/15/2023   HCT 44.5 10/15/2023   MCV 95 10/15/2023   PLT 255 10/15/2023   Lab Results  Component Value Date   NA 137 10/15/2023   K 4.7 10/15/2023   CO2 22 10/15/2023   GLUCOSE 86 10/15/2023   BUN 19  10/15/2023   CREATININE 1.21 10/15/2023   BILITOT 0.5 04/14/2022   ALKPHOS 121 04/14/2022   AST 20 04/14/2022   ALT 16 04/14/2022   PROT 6.7 04/14/2022   ALBUMIN 4.6 04/14/2022   CALCIUM  9.4 10/15/2023   ANIONGAP 8 03/13/2015   EGFR 59 (L) 10/15/2023   Lab Results  Component Value Date   CHOL 157 04/14/2022   Lab Results  Component Value Date   HDL 63 04/14/2022   Lab Results  Component Value Date   LDLCALC 77 04/14/2022   Lab Results  Component Value Date   TRIG 89 04/14/2022   Lab Results  Component Value Date   CHOLHDL 2.5 04/14/2022   Lab Results  Component Value Date   HGBA1C 5.6 01/02/2023      Assessment & Plan:   Problem List Items Addressed This Visit       Cardiovascular and Mediastinum   Aortic  stenosis - Primary   S/p TAVR (07/25) On aspirin, Plavix and statin Has systolic murmur Followed by Cardiology Asymptomatic currently      Essential hypertension   BP Readings from Last 1 Encounters:  12/15/23 138/68   Well-controlled with losartan 25 mg QD Counseled for compliance with the medications Advised DASH diet and moderate exercise/walking as tolerated        Respiratory   Severe persistent asthma without complication (HCC)   Followed by Allergy clinic Has Albuterol , needs to use PRN for dyspnea - rarely needs it On Fasenra         Genitourinary   Stage 3a chronic kidney disease (HCC)   Last BMP reviewed, usually GFR ranges 50-55 GFR ranges around 55 On losartan and Jardiance Maintain adequate hydration Avoid nephrotoxic agents Recheck BMP        Other   Hyperlipidemia   On Lipitor 10 mg QD Lipid profile reviewed      Tingling of both feet   Could be related to nerve compression around ankle area, as it improves with removing the shoes He has had intermittent low back pain, may have some component of lumbar radiculopathy as well He has tingling does not affect his daily functioning, would avoid further investigation for now      Other Visit Diagnoses       Encounter for immunization       Relevant Orders   Flu vaccine HIGH DOSE PF(Fluzone Trivalent) (Completed)           No orders of the defined types were placed in this encounter.   Follow-up: Return in about 6 months (around 06/14/2024) for HTN and CKD.    Suzzane MARLA Blanch, MD

## 2023-12-15 NOTE — Assessment & Plan Note (Signed)
 Could be related to nerve compression around ankle area, as it improves with removing the shoes He has had intermittent low back pain, may have some component of lumbar radiculopathy as well He has tingling does not affect his daily functioning, would avoid further investigation for now

## 2023-12-15 NOTE — Assessment & Plan Note (Signed)
 On Lipitor 10 mg QD Lipid profile reviewed

## 2023-12-15 NOTE — Assessment & Plan Note (Signed)
 Last BMP reviewed, usually GFR ranges 50-55 GFR ranges around 55 On losartan and Jardiance Maintain adequate hydration Avoid nephrotoxic agents Recheck BMP

## 2023-12-15 NOTE — Patient Instructions (Addendum)
 Please continue to take medications as prescribed.  Please continue to follow low salt diet and perform moderate exercise/walking as tolerated.

## 2023-12-15 NOTE — Assessment & Plan Note (Signed)
Followed by Allergy clinic Has Albuterol, needs to use PRN for dyspnea - rarely needs it On Fasenra

## 2023-12-21 ENCOUNTER — Ambulatory Visit

## 2023-12-21 DIAGNOSIS — J4550 Severe persistent asthma, uncomplicated: Secondary | ICD-10-CM

## 2024-01-04 ENCOUNTER — Ambulatory Visit

## 2024-02-15 ENCOUNTER — Ambulatory Visit

## 2024-02-15 DIAGNOSIS — J4550 Severe persistent asthma, uncomplicated: Secondary | ICD-10-CM

## 2024-02-15 MED ORDER — BENRALIZUMAB 30 MG/ML ~~LOC~~ SOSY
30.0000 mg | PREFILLED_SYRINGE | SUBCUTANEOUS | Status: AC
  Administered 2024-02-15: 09:00:00 30 mg via SUBCUTANEOUS

## 2024-04-11 ENCOUNTER — Ambulatory Visit

## 2024-05-11 ENCOUNTER — Ambulatory Visit: Admitting: Allergy & Immunology

## 2024-06-14 ENCOUNTER — Ambulatory Visit: Admitting: Internal Medicine
